# Patient Record
Sex: Female | Born: 1950 | Race: White | Hispanic: No | Marital: Married | Smoking: Never smoker
Health system: Southern US, Community
[De-identification: ages and names within clinical notes are randomized; demographics above are authoritative.]

## PROBLEM LIST (undated history)

## (undated) ENCOUNTER — Emergency Department (HOSPITAL_COMMUNITY): Admission: EM | Source: Home / Self Care

## (undated) DIAGNOSIS — E669 Obesity, unspecified: Secondary | ICD-10-CM

## (undated) DIAGNOSIS — E785 Hyperlipidemia, unspecified: Secondary | ICD-10-CM

## (undated) DIAGNOSIS — F419 Anxiety disorder, unspecified: Secondary | ICD-10-CM

## (undated) DIAGNOSIS — Z800 Family history of malignant neoplasm of digestive organs: Secondary | ICD-10-CM

## (undated) DIAGNOSIS — Z808 Family history of malignant neoplasm of other organs or systems: Secondary | ICD-10-CM

## (undated) DIAGNOSIS — C801 Malignant (primary) neoplasm, unspecified: Secondary | ICD-10-CM

## (undated) DIAGNOSIS — I10 Essential (primary) hypertension: Secondary | ICD-10-CM

## (undated) DIAGNOSIS — Z85820 Personal history of malignant melanoma of skin: Secondary | ICD-10-CM

## (undated) DIAGNOSIS — M1990 Unspecified osteoarthritis, unspecified site: Secondary | ICD-10-CM

## (undated) DIAGNOSIS — Z803 Family history of malignant neoplasm of breast: Secondary | ICD-10-CM

## (undated) HISTORY — DX: Family history of malignant neoplasm of breast: Z80.3

## (undated) HISTORY — DX: Malignant (primary) neoplasm, unspecified: C80.1

## (undated) HISTORY — DX: Family history of malignant neoplasm of other organs or systems: Z80.8

## (undated) HISTORY — DX: Personal history of malignant melanoma of skin: Z85.820

## (undated) HISTORY — DX: Obesity, unspecified: E66.9

## (undated) HISTORY — PX: ABDOMINAL HYSTERECTOMY: SHX81

## (undated) HISTORY — DX: Hyperlipidemia, unspecified: E78.5

## (undated) HISTORY — PX: BREAST EXCISIONAL BIOPSY: SUR124

## (undated) HISTORY — DX: Unspecified osteoarthritis, unspecified site: M19.90

## (undated) HISTORY — PX: OTHER SURGICAL HISTORY: SHX169

## (undated) HISTORY — DX: Anxiety disorder, unspecified: F41.9

## (undated) HISTORY — DX: Family history of malignant neoplasm of digestive organs: Z80.0

## (undated) HISTORY — DX: Essential (primary) hypertension: I10

---

## 1976-05-12 HISTORY — PX: APPENDECTOMY: SHX54

## 2000-02-04 ENCOUNTER — Other Ambulatory Visit: Admission: RE | Admit: 2000-02-04 | Discharge: 2000-02-04 | Payer: Self-pay | Admitting: Obstetrics and Gynecology

## 2000-02-28 ENCOUNTER — Encounter: Admission: RE | Admit: 2000-02-28 | Discharge: 2000-05-28 | Payer: Self-pay | Admitting: Obstetrics and Gynecology

## 2000-03-05 ENCOUNTER — Encounter: Payer: Self-pay | Admitting: Obstetrics and Gynecology

## 2000-03-05 ENCOUNTER — Encounter: Admission: RE | Admit: 2000-03-05 | Discharge: 2000-03-05 | Payer: Self-pay | Admitting: Obstetrics and Gynecology

## 2000-03-10 ENCOUNTER — Encounter: Admission: RE | Admit: 2000-03-10 | Discharge: 2000-03-10 | Payer: Self-pay | Admitting: Obstetrics and Gynecology

## 2000-03-10 ENCOUNTER — Encounter: Payer: Self-pay | Admitting: Obstetrics and Gynecology

## 2001-03-16 ENCOUNTER — Encounter: Admission: RE | Admit: 2001-03-16 | Discharge: 2001-03-16 | Payer: Self-pay | Admitting: Obstetrics and Gynecology

## 2001-03-16 ENCOUNTER — Encounter: Payer: Self-pay | Admitting: Obstetrics and Gynecology

## 2001-06-29 ENCOUNTER — Other Ambulatory Visit: Admission: RE | Admit: 2001-06-29 | Discharge: 2001-06-29 | Payer: Self-pay | Admitting: Obstetrics and Gynecology

## 2002-03-17 ENCOUNTER — Encounter: Admission: RE | Admit: 2002-03-17 | Discharge: 2002-03-17 | Payer: Self-pay | Admitting: Obstetrics and Gynecology

## 2002-03-17 ENCOUNTER — Encounter: Payer: Self-pay | Admitting: Obstetrics and Gynecology

## 2002-07-18 ENCOUNTER — Other Ambulatory Visit: Admission: RE | Admit: 2002-07-18 | Discharge: 2002-07-18 | Payer: Self-pay | Admitting: Obstetrics and Gynecology

## 2002-10-18 ENCOUNTER — Ambulatory Visit (HOSPITAL_COMMUNITY): Admission: RE | Admit: 2002-10-18 | Discharge: 2002-10-18 | Payer: Self-pay | Admitting: Obstetrics and Gynecology

## 2002-10-19 ENCOUNTER — Encounter (INDEPENDENT_AMBULATORY_CARE_PROVIDER_SITE_OTHER): Payer: Self-pay

## 2003-01-24 ENCOUNTER — Encounter: Payer: Self-pay | Admitting: Gastroenterology

## 2003-01-24 ENCOUNTER — Encounter: Admission: RE | Admit: 2003-01-24 | Discharge: 2003-01-24 | Payer: Self-pay | Admitting: Gastroenterology

## 2003-02-01 ENCOUNTER — Encounter (INDEPENDENT_AMBULATORY_CARE_PROVIDER_SITE_OTHER): Payer: Self-pay | Admitting: Specialist

## 2003-02-01 ENCOUNTER — Ambulatory Visit (HOSPITAL_COMMUNITY): Admission: RE | Admit: 2003-02-01 | Discharge: 2003-02-01 | Payer: Self-pay | Admitting: Gastroenterology

## 2003-02-03 ENCOUNTER — Encounter: Payer: Self-pay | Admitting: Obstetrics and Gynecology

## 2003-02-03 ENCOUNTER — Ambulatory Visit (HOSPITAL_COMMUNITY): Admission: RE | Admit: 2003-02-03 | Discharge: 2003-02-03 | Payer: Self-pay | Admitting: Obstetrics and Gynecology

## 2003-02-06 ENCOUNTER — Ambulatory Visit (HOSPITAL_COMMUNITY): Admission: RE | Admit: 2003-02-06 | Discharge: 2003-02-06 | Payer: Self-pay | Admitting: Gastroenterology

## 2003-02-07 ENCOUNTER — Encounter (INDEPENDENT_AMBULATORY_CARE_PROVIDER_SITE_OTHER): Payer: Self-pay | Admitting: *Deleted

## 2003-02-09 ENCOUNTER — Encounter: Admission: RE | Admit: 2003-02-09 | Discharge: 2003-02-09 | Payer: Self-pay | Admitting: Obstetrics and Gynecology

## 2003-02-09 ENCOUNTER — Encounter: Payer: Self-pay | Admitting: Obstetrics and Gynecology

## 2003-02-09 ENCOUNTER — Encounter (INDEPENDENT_AMBULATORY_CARE_PROVIDER_SITE_OTHER): Payer: Self-pay | Admitting: *Deleted

## 2003-02-09 ENCOUNTER — Ambulatory Visit (HOSPITAL_COMMUNITY): Admission: RE | Admit: 2003-02-09 | Discharge: 2003-02-09 | Payer: Self-pay | Admitting: Obstetrics and Gynecology

## 2003-02-15 ENCOUNTER — Encounter (INDEPENDENT_AMBULATORY_CARE_PROVIDER_SITE_OTHER): Payer: Self-pay | Admitting: Specialist

## 2003-02-15 ENCOUNTER — Encounter (INDEPENDENT_AMBULATORY_CARE_PROVIDER_SITE_OTHER): Payer: Self-pay

## 2003-02-15 ENCOUNTER — Ambulatory Visit (HOSPITAL_COMMUNITY): Admission: RE | Admit: 2003-02-15 | Discharge: 2003-02-15 | Payer: Self-pay | Admitting: Obstetrics and Gynecology

## 2003-03-20 ENCOUNTER — Ambulatory Visit (HOSPITAL_COMMUNITY): Admission: RE | Admit: 2003-03-20 | Discharge: 2003-03-20 | Payer: Self-pay | Admitting: General Surgery

## 2003-03-29 ENCOUNTER — Ambulatory Visit: Admission: RE | Admit: 2003-03-29 | Discharge: 2003-03-29 | Payer: Self-pay | Admitting: Gynecologic Oncology

## 2003-05-08 ENCOUNTER — Ambulatory Visit (HOSPITAL_COMMUNITY): Admission: RE | Admit: 2003-05-08 | Discharge: 2003-05-08 | Payer: Self-pay | Admitting: Gynecology

## 2003-05-12 ENCOUNTER — Ambulatory Visit (HOSPITAL_COMMUNITY): Admission: RE | Admit: 2003-05-12 | Discharge: 2003-05-12 | Payer: Self-pay | Admitting: Gynecology

## 2003-05-31 ENCOUNTER — Ambulatory Visit: Admission: RE | Admit: 2003-05-31 | Discharge: 2003-05-31 | Payer: Self-pay | Admitting: Gynecologic Oncology

## 2003-08-24 ENCOUNTER — Ambulatory Visit (HOSPITAL_COMMUNITY): Admission: RE | Admit: 2003-08-24 | Discharge: 2003-08-24 | Payer: Self-pay | Admitting: Gynecologic Oncology

## 2003-08-29 ENCOUNTER — Ambulatory Visit: Admission: RE | Admit: 2003-08-29 | Discharge: 2003-08-29 | Payer: Self-pay | Admitting: Gynecologic Oncology

## 2004-02-22 ENCOUNTER — Other Ambulatory Visit: Admission: RE | Admit: 2004-02-22 | Discharge: 2004-02-22 | Payer: Self-pay | Admitting: Obstetrics and Gynecology

## 2004-03-08 ENCOUNTER — Encounter: Admission: RE | Admit: 2004-03-08 | Discharge: 2004-03-08 | Payer: Self-pay | Admitting: Obstetrics and Gynecology

## 2004-03-19 ENCOUNTER — Ambulatory Visit (HOSPITAL_COMMUNITY): Admission: RE | Admit: 2004-03-19 | Discharge: 2004-03-19 | Payer: Self-pay | Admitting: Obstetrics and Gynecology

## 2004-08-13 ENCOUNTER — Encounter (INDEPENDENT_AMBULATORY_CARE_PROVIDER_SITE_OTHER): Payer: Self-pay | Admitting: Specialist

## 2004-08-13 ENCOUNTER — Ambulatory Visit (HOSPITAL_COMMUNITY): Admission: RE | Admit: 2004-08-13 | Discharge: 2004-08-13 | Payer: Self-pay | Admitting: Obstetrics and Gynecology

## 2004-11-13 ENCOUNTER — Ambulatory Visit (HOSPITAL_COMMUNITY): Admission: RE | Admit: 2004-11-13 | Discharge: 2004-11-13 | Payer: Self-pay | Admitting: Orthopedic Surgery

## 2004-11-13 ENCOUNTER — Ambulatory Visit (HOSPITAL_BASED_OUTPATIENT_CLINIC_OR_DEPARTMENT_OTHER): Admission: RE | Admit: 2004-11-13 | Discharge: 2004-11-13 | Payer: Self-pay | Admitting: Orthopedic Surgery

## 2005-01-23 ENCOUNTER — Encounter: Admission: RE | Admit: 2005-01-23 | Discharge: 2005-01-23 | Payer: Self-pay | Admitting: Orthopedic Surgery

## 2005-04-23 ENCOUNTER — Other Ambulatory Visit: Admission: RE | Admit: 2005-04-23 | Discharge: 2005-04-23 | Payer: Self-pay | Admitting: Obstetrics and Gynecology

## 2005-04-25 ENCOUNTER — Ambulatory Visit (HOSPITAL_COMMUNITY): Admission: RE | Admit: 2005-04-25 | Discharge: 2005-04-25 | Payer: Self-pay | Admitting: Obstetrics and Gynecology

## 2005-05-21 ENCOUNTER — Encounter: Admission: RE | Admit: 2005-05-21 | Discharge: 2005-05-21 | Payer: Self-pay | Admitting: Obstetrics and Gynecology

## 2006-06-01 ENCOUNTER — Encounter: Admission: RE | Admit: 2006-06-01 | Discharge: 2006-06-01 | Payer: Self-pay | Admitting: Obstetrics and Gynecology

## 2006-06-04 ENCOUNTER — Encounter: Admission: RE | Admit: 2006-06-04 | Discharge: 2006-06-04 | Payer: Self-pay | Admitting: Obstetrics and Gynecology

## 2006-07-17 ENCOUNTER — Ambulatory Visit (HOSPITAL_COMMUNITY): Admission: RE | Admit: 2006-07-17 | Discharge: 2006-07-17 | Payer: Self-pay | Admitting: Obstetrics and Gynecology

## 2006-08-25 ENCOUNTER — Encounter (INDEPENDENT_AMBULATORY_CARE_PROVIDER_SITE_OTHER): Payer: Self-pay | Admitting: Specialist

## 2006-08-25 ENCOUNTER — Inpatient Hospital Stay (HOSPITAL_COMMUNITY): Admission: RE | Admit: 2006-08-25 | Discharge: 2006-08-27 | Payer: Self-pay | Admitting: Obstetrics and Gynecology

## 2007-06-09 ENCOUNTER — Encounter: Admission: RE | Admit: 2007-06-09 | Discharge: 2007-06-09 | Payer: Self-pay | Admitting: Obstetrics and Gynecology

## 2008-05-12 HISTORY — PX: OTHER SURGICAL HISTORY: SHX169

## 2008-06-19 ENCOUNTER — Encounter: Admission: RE | Admit: 2008-06-19 | Discharge: 2008-06-19 | Payer: Self-pay | Admitting: Family Medicine

## 2008-08-02 ENCOUNTER — Encounter: Admission: RE | Admit: 2008-08-02 | Discharge: 2008-08-02 | Payer: Self-pay | Admitting: Family Medicine

## 2008-09-20 ENCOUNTER — Inpatient Hospital Stay (HOSPITAL_COMMUNITY): Admission: RE | Admit: 2008-09-20 | Discharge: 2008-09-22 | Payer: Self-pay | Admitting: Neurosurgery

## 2009-07-23 ENCOUNTER — Encounter: Admission: RE | Admit: 2009-07-23 | Discharge: 2009-07-23 | Payer: Self-pay | Admitting: Obstetrics and Gynecology

## 2010-03-19 ENCOUNTER — Ambulatory Visit: Payer: Self-pay | Admitting: Cardiology

## 2010-03-19 ENCOUNTER — Telehealth (INDEPENDENT_AMBULATORY_CARE_PROVIDER_SITE_OTHER): Payer: Self-pay | Admitting: *Deleted

## 2010-03-20 ENCOUNTER — Encounter: Payer: Self-pay | Admitting: Cardiology

## 2010-03-20 ENCOUNTER — Encounter (HOSPITAL_COMMUNITY)
Admission: RE | Admit: 2010-03-20 | Discharge: 2010-05-11 | Payer: Self-pay | Source: Home / Self Care | Attending: Cardiology | Admitting: Cardiology

## 2010-03-20 ENCOUNTER — Ambulatory Visit: Payer: Self-pay

## 2010-03-20 ENCOUNTER — Ambulatory Visit: Payer: Self-pay | Admitting: Cardiology

## 2010-03-21 ENCOUNTER — Ambulatory Visit: Payer: Self-pay | Admitting: Cardiology

## 2010-03-21 ENCOUNTER — Encounter: Payer: Self-pay | Admitting: Cardiology

## 2010-03-21 ENCOUNTER — Ambulatory Visit: Payer: Self-pay

## 2010-03-21 ENCOUNTER — Ambulatory Visit (HOSPITAL_COMMUNITY): Admission: RE | Admit: 2010-03-21 | Discharge: 2010-03-21 | Payer: Self-pay | Admitting: Cardiology

## 2010-03-27 ENCOUNTER — Ambulatory Visit: Payer: Self-pay | Admitting: Cardiology

## 2010-06-02 ENCOUNTER — Encounter: Payer: Self-pay | Admitting: Obstetrics and Gynecology

## 2010-06-11 NOTE — Assessment & Plan Note (Signed)
Summary: Cardiology Nuclear Testing  Nuclear Med Background Indications for Stress Test: Evaluation for Ischemia     Symptoms: Chest Pain, Chest Pain with Exertion, Chest Tightness, Diaphoresis    Nuclear Pre-Procedure Cardiac Risk Factors: Family History - CAD, Hypertension, Lipids, Obesity Caffeine/Decaff Intake: None NPO After: 11:00 PM Lungs: clear IV 0.9% NS with Angio Cath: 22g     IV Site: R Antecubital IV Started by: Irean Hong, RN Chest Size (in) 42     Cup Size DD     Height (in): 64 Weight (lb): 233 BMI: 40.14  Nuclear Med Study 1 or 2 day study:  2 day     Stress Test Type:  Treadmill/Lexiscan Reading MD:  Olga Millers, MD     Referring MD:  S.Tennant Resting Radionuclide:  Technetium 58m Tetrofosmin     Resting Radionuclide Dose:  33 mCi  Stress Radionuclide:  Technetium 70m Tetrofosmin     Stress Radionuclide Dose:  32.5 mCi   Stress Protocol  Max Systolic BP: 141 mm Hg Lexiscan: 0.4 mg   Stress Test Technologist:  Milana Na, EMT-P     Nuclear Technologist:  Domenic Polite, CNMT  Rest Procedure  Myocardial perfusion imaging was performed at rest 45 minutes following the intravenous administration of Technetium 2m Tetrofosmin.  Stress Procedure  The patient received IV Lexiscan 0.4 mg over 15-seconds with concurrent low level exercise and then Technetium 37m Tetrofosmin was injected at 30-seconds while the patient continued walking one more minute.  There were no significant changes with Lexiscan.  Quantitative spect images were obtained after a 45 minute delay.  QPS Raw Data Images:  Acquisition technically good; normal left ventricular size. Stress Images:  Normal homogeneous uptake in all areas of the myocardium. Rest Images:  Normal homogeneous uptake in all areas of the myocardium. Subtraction (SDS):  No evidence of ischemia. Transient Ischemic Dilatation:  0.90  (Normal <1.22)  Lung/Heart Ratio:  0.27  (Normal <0.45)  Quantitative  Gated Spect Images QGS EDV:  102 ml QGS ESV:  29 ml QGS EF:  72 % QGS cine images:  Normal wall motion.   Overall Impression  Exercise Capacity: Lexiscan with no exercise. BP Response: Normal blood pressure response. Clinical Symptoms: No chest pain ECG Impression: No significant ST segment change suggestive of ischemia. Overall Impression: Normal lexiscan nuclear study with no ischemia or infarction.  Appended Document: Cardiology Nuclear Testing copy sent to Dr. Deborah Chalk

## 2010-06-11 NOTE — Progress Notes (Signed)
Summary: nuc pre procedure  Phone Note Outgoing Call Call back at Home Phone [phone]   Call placed by: Cathlyn Parsons RN,  March 19, 2010 5:03 PM Call placed to: Patient Reason for Call: Confirm/change Appt Summary of Call: Reviewed information on [employer] Information Sheet (see scanned document for further details).  Spoke with patients husband.     Nuclear Med Background Indications for Stress Test: Evaluation for Ischemia     Symptoms: Chest Pain, Chest Pain with Exertion, Chest Tightness, Diaphoresis    Nuclear Pre-Procedure Cardiac Risk Factors: Family History - CAD, Hypertension, Lipids, Obesity

## 2010-08-20 LAB — BASIC METABOLIC PANEL
BUN: 16 mg/dL (ref 6–23)
CO2: 27 mEq/L (ref 19–32)
Calcium: 9.4 mg/dL (ref 8.4–10.5)
Chloride: 100 mEq/L (ref 96–112)
Creatinine, Ser: 0.73 mg/dL (ref 0.4–1.2)
GFR calc Af Amer: >60 mL/min (ref >60)
GFR calc non Af Amer: >60 mL/min (ref >60)
Glucose, Bld: 87 mg/dL (ref 70–99)
Potassium: 4 mEq/L (ref 3.5–5.1)
Sodium: 134 mEq/L — ABNORMAL LOW (ref 135–145)

## 2010-08-20 LAB — CBC
HCT: 24.1 % — ABNORMAL LOW (ref 36.0–46.0)
HCT: 36.3 % (ref 36.0–46.0)
Hemoglobin: 12.5 g/dL (ref 12.0–15.0)
Hemoglobin: 8.4 g/dL — ABNORMAL LOW (ref 12.0–15.0)
MCHC: 34.5 g/dL (ref 30.0–36.0)
MCV: 84.1 fL (ref 78.0–100.0)
MCV: 84.3 fL (ref 78.0–100.0)
Platelets: 363 10*3/uL (ref 150–400)
RBC: 2.87 MIL/uL — ABNORMAL LOW (ref 3.87–5.11)
RBC: 4.31 MIL/uL (ref 3.87–5.11)
RDW: 13.6 % (ref 11.5–15.5)
WBC: 7.3 10*3/uL (ref 4.0–10.5)
WBC: 9.7 10*3/uL (ref 4.0–10.5)

## 2010-08-20 LAB — TYPE AND SCREEN
ABO/RH(D): O POS
Antibody Screen: NEGATIVE

## 2010-08-20 LAB — ABO/RH: ABO/RH(D): O POS

## 2010-09-24 NOTE — H&P (Signed)
NAME:  [name], [name]             ACCOUNT NO.:  [id number]   MEDICAL RECORD NO.:  [id number]          PATIENT TYPE:  INP   LOCATION:  3031                         FACILITY:  [facility]   PHYSICIAN:  Hewitt Shorts, M.D.DATE OF BIRTH:  [birth year]   DATE OF ADMISSION:  09/20/2008  DATE OF DISCHARGE:                              HISTORY & PHYSICAL   HISTORY OF PRESENT ILLNESS:  The patient is a 60 year old left-handed  white female whom I evaluated for lumbar degeneration and resulting low  back and right lumbar radicular pain.  The patient has had pain from the  low back down to the right buttock, lateral right thigh and leg, into  the dorsum of the right foot for the past 2 years worse over the past 5  months.   She is treated with prednisone dose pack and ongoing ibuprofen, continue  to have pain, discomfort.  X-rays showed multilevel degenerative disk  disease, spondylosis with a grade I anterolisthesis of L5 on S1 that  appears to be static to flexion and extension.  MRI scan shows at L4-5  advanced facet arthropathy with circumferential disk bulging and  significant canal stenosis with bilateral neural foraminal stenosis  right worse than left at L5-S1, grade I degenerative spondylolisthesis  with bilateral facet arthropathy but more mild canal stenosis.   A decision was made to proceed with decompression and arthrodesis.   PAST MEDICAL HISTORY:  Notable for history of hypertension for the past  2 years, history of a melanoma removed in 2007, no history of myocardial  infarction, stroke, diabetes, peptic ulcer disease, or lung disease.   PREVIOUS SURGERIES:  Hysterectomy, bilateral knee arthroscopy, right  kidney surgery, appendectomy, and resection of melanoma.   ALLERGIES:  No known allergies.   MEDICATIONS:  1. Bisoprolol/hydrochlorothiazide 5/6.25 daily for hypertension.  2. Vivelle-Dot 0.1 mg patch twice a week.  3. Lipitor 20 mg daily.  4. Estradiol 1 mg daily.  5.  Fluoxetine 20 mg daily.  6. Diclofenac p.r.n.  7. Ibuprofen p.r.n.  8. Lunesta 3 mg p.r.n.   FAMILY HISTORY:  Mother died of lung cancer at age 28, father is in fair  health at age 66 with heart disease.  There is a family history as well  of hypertension.   SOCIAL HISTORY:  The patient is married, she is a retired Runner, broadcasting/film/video.  She  does not smoke.  She does drink alcohol socially.  She denies history of  substance abuse.   REVIEW OF SYSTEMS:  Notable for those described in the history of  present illness and past medical history, but is otherwise unremarkable.   PHYSICAL EXAMINATION:  GENERAL:  The patient is a well-developed and  well-nourished white female in no acute distress.  VITAL SIGNS:  Temperature 97.9, pulse 58, blood pressure 119/78,  respiratory rate 20, height 5 feet 3 inches, and weight 210 pounds.  LUNGS:  Clear to auscultation.  She has symmetrical respiratory  excursion.  HEART:  Regular rate and rhythm.  Normal S1 and S2.  There is no murmur.  EXTREMITIES:  No clubbing, cyanosis, or edema.  MUSCULOSKELETAL:  She  was able to flex 90 degrees and able to extend to  10 degrees, mild discomfort with flexion, and extension.  Mild  tenderness to palpation in the right, no radiation down to the left, no  tenderness over the lumbar spinous process.  Straight leg raising is  negative bilaterally.  NEUROLOGIC:  5/5 strength in the lower extremities in the iliopsoas,  quadriceps, dorsiflexors, extensor hallucis longus, and plantar flexor  bilaterally.  Sensation is intact to pinprick to the upper and lower  extremities.  Reflexes are minimal in the biceps, brachioradialis, and  triceps, 1 in the quadriceps, minimal in the gastrocnemius but are  symmetrical bilaterally.  Toes are downgoing bilaterally.  She has a  normal gait and stance.   IMPRESSION:  The patient with low back and right lumbar radicular pain  with advanced degenerative disk disease and spondylosis in the  lower  lumbar spine particularly at L4-5 and L5-S1 with mild changes above  that.  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   PLAN:  Options of the nonsurgical management versus surgical  decompression and arthrodesis were discussed.  We also included mostly  nonsurgical management and continuing current symptomatic treatment with  medications versus epidural steroid injections.  As regard to surgery,  we discussed a bilateral L4-5 and L5-S1 lumbar decompression including  laminotomy, facetectomy, foraminotomy, and microdiscectomy.  Bilateral  L4-5 and L5-S1 posterior lumbar interbody fusion with interbody implants  and bone graft and bilateral L4-5 and L5-S1 posterolateral arthrodesis  with posterior instrumentation and bone graft.   I have discussed all these.  She would like to go ahead with surgery and  is admitted for such.  We discussed the nature and extent of the  surgery, typical length of surgery, hospital stay, and overall  recuperation, need for postoperative immobilization in a lumbar brace  and risks including risks of infection, bleeding, possible need for  transfusion, risk of nerve dysfunction with pain, weakness, numbness, or  paresthesias, the risk of dural tear and CSF leakage, possible need of  further surgery, the risk of failure of the arthrodesis and possible  need of further surgery and anesthetic risk of myocardial infarction,  stroke, pneumonia, and death.  Understanding all this, she would like to  go ahead with surgery and is now admitted for such.      Hewitt Shorts, M.D.  Electronically Signed     RWN/MEDQ  D:  09/20/2008  T:  09/20/2008  Job:  [id number]

## 2010-09-24 NOTE — Op Note (Signed)
NAME:  [name] [name], [name] [name]             ACCOUNT NO.:  [id number]   MEDICAL RECORD NO.:  [id number]          PATIENT TYPE:  INP   LOCATION:  3031                         FACILITY:  [facility]   PHYSICIAN:  Hewitt Shorts, M.D.DATE OF BIRTH:  1950-09-04   DATE OF PROCEDURE:  09/20/2008  DATE OF DISCHARGE:                               OPERATIVE REPORT   PREOPERATIVE DIAGNOSES:  1. Lumbar spondylosis.  2. Lumbar degenerative disk disease.  3. Lumbar stenosis.  4. Lumbar radiculopathy.   POSTOPERATIVE DIAGNOSES:  1. Lumbar spondylosis.  2. Lumbar degenerative disk disease.  3. Lumbar stenosis.  4. Lumbar radiculopathy.   PROCEDURE:  Bilateral L4-5 and L5-S1 lumbar decompression including  laminotomy, facetectomy, foraminotomy, and microdiskectomy with  microdissection with decompression beyond that required for interbody  fusion, bilateral L4-5 and L5-S1 posterior lumbar interbody fusion with  AVS PEEK interbody implants and mosaic with bone marrow aspirate and  bilateral L5-S1 posterolateral arthrodesis with radius posterior  instrumentation and mosaic with bone marrow aspirates and infuse.   SURGEON:  Hewitt Shorts, MD   ASSISTANT:  Nelia Shi. Webb Silversmith, RN and Cristi Loron, MD   ANESTHESIA:  General endotracheal.   INDICATIONS:  The patient is a 60 year old woman who presents with low  back and radicular pain.  She has advanced degenerative disk disease and  spondylosis in the lumbar spine particularly at L4-5 and L5-S1 with  associated degenerative scoliosis of the lower lumbar spine and static  grade 1 spondylolisthesis at L4-L5 and bilateral facet arthropathy at L4-  5 with neuroforaminal stenosis right versus left and significant canal  stenosis at L5-S1.  There is significant bilateral facet arthropathy as  well with mild stenosis.  A decision was made to proceed with  decompression and arthrodesis.   PROCEDURE:  The patient was brought to the operating room and  placed  under general endotracheal anesthesia.  The patient was turned to a  prone position.  Lumbar region was prepped with Betadine soap and  solution, and draped in a sterile fashion.  The midline was infiltrated  with local anesthetic with epinephrine and midline incision was made  over the L4-5 and L5-S1 level, this was then carried down through the  subcutaneous tissue.  Bipolar cautery and electrocautery were used to  maintain hemostasis.  Dissection was carried down to the lumbar fascia,  which was incised bilaterally and the paraspinal muscles were dissected  from the spinous process and lamina in a subperiosteal fashion.  A self-  retaining retractor was placed and the L4-5 and L5-S1 interlaminar space  was identified and an x-ray was taken to confirm the localization and  then using microdissection and microsurgical technique with  magnification, we proceeded with decompression.   Bilateral L4-5 and L5-S1 laminotomy and facetectomy was performed using  the XMax drill and Kerrison punches.  Ligamentum flavum was thickened  particularly at the L4-5 level and this was carefully removed so as to  decompress the thecal sac and spinal canal.  We then turned to our  attention to the neural foramina where there was significant  hypertrophic facet arthropathy.  The  hypertrophic facets were carefully  removed and we were able to decompress the exiting L4, L5, and S1 nerve  roots bilaterally.  Once the laminotomy, facetectomy, and foraminotomy  was performed, we proceeded with microdiskectomy.  This was performed  bilaterally at L4-5 and L5-S1.  There was significant spondylitic disk  protrusion at each level.  The annulus was incised.  The spondylitic  overgrowth was removed using an osteophyte removal tool bilaterally at  each level.  Disk space was entered using a variety of microcurettes and  pituitary rongeurs and third diskectomy was performed.  We then began to  prepare the end  plates to reach the vertebral body surfaces with paddle  curettes and once we were down to good bony surfaces, we measured the  height of the intervertebral disk space.  There were some asymmetry at  L4-5; therefore, we used different size grafts in the left and the  right.   Once the end plates were prepared, we draped the C-arm fluoroscope was  brought to the field, we identified the left L5 pedicle.  It was probed  with pedicle probe and bone marrow aspirate was aspirated from the  vertebral body and injected over a 15 mL strip of mosaic.  We then  packed each of the AVS PEEK interbody implants with the mosaic with bone  marrow aspirate.  All the implants were 25 mm in depth and 4 degrees of  lordosis.  We used a 10-mm implant in the left at L4-5 and a 9-mm  implant on the right at L4-5 and bilateral 8-mm implants at L5-S1.   Carefully retracting the thecal sac and nerve root first on the right  side at L4-5, we placed the 9-mm implants.  We then packed additional  mosaic with bone marrow aspirate in the midline and then placed the left-  sided 10-mm implants.  Each of these was countersunk.  We then similarly  placed the 8-mm implant on the right L4-5, subsequently on the left at  L4-5 and again they were both countersunk.  Once all 4 implants were in  place, we proceeded with posterolateral arthrodesis.   With C-arm fluoroscopic guidance, we probed each of the remaining 5  pedicles, bilateral L4, on the right side L5, and bilaterally S1.  Each  of the 6 pedicles was examined with a ball probe.  Good bony surfaces  were noted.  Each of the pedicles were intact with a 5.25-mm tap.  Each  of them was examined with a ball probe, good threading was noted, and  good bony surfaces were noted.  We placed 5.75-mm screws bilaterally at  each level, placing 40-mm screws bilaterally at L4 and L5 and 30-mm  screws bilaterally at S1.  Once all 6 screws were in place, an AP view  shot was taken.   We did question the positioning of the right L4 screw;  however, we removed it, examined the bony surfaces, and again found good  bony surfaces on the superior, lateral, inferior, and medial aspects and  the screw was replaced.   We selected 60-mm prelordosed rods that were placed within the screw  heads and secured with locking caps.  Once all 6 locking caps were in  place, we tightened against the counter torque.  We previously  decorticated the transverse processes in facets of L4, L5, and S1 (the  ala of S1) and then packed Infuse and the remaining mosaic with bone  marrow aspirate in the lateral gutters over  the transverse process and  intertransverse space.  The wound was irrigated extensively with saline  solution, Bacitracin solution throughout the procedure.  Good hemostasis  was maintained.  We did use a Cell Saver during the procedure.  The  estimated blood loss was 600 mL and we were able to return 205 mL of  Cell Saver blood to the patient.   Once hemostasis was confirmed, we proceeded with closure.  The deep  fascia was closed with interrupted undyed #1 Vicryl sutures.  Scarpa  fascia was closed with interrupted undyed #1 Vicryl sutures.  The  subcutaneous and subcuticular were closed with interrupted inverted 2-0  Vicryl sutures.  The skin was approximated with Dermabond.  The wound was dressed with Adaptic, sterile gauze, and 4-inch Hypafix.  The procedure was tolerated well.  Following the surgery, the patient  was turned back to the supine position, reversed from the anesthetic,  extubated, and transferred to the recovery room for further care.      Hewitt Shorts, M.D.  Electronically Signed     RWN/MEDQ  D:  09/20/2008  T:  09/21/2008  Job:  [id number]

## 2010-09-27 NOTE — Op Note (Signed)
NAME:  [name] [name], [name] [name]                       ACCOUNT NO.:  [id number]   MEDICAL RECORD NO.:  [id number]                   PATIENT TYPE:  AMB   LOCATION:  ENDO                                 FACILITY:  [facility]   PHYSICIAN:  Anselmo Rod, M.D.               DATE OF BIRTH:  Jun 09, 1950   DATE OF PROCEDURE:  02/01/2003  DATE OF DISCHARGE:                                 OPERATIVE REPORT   PROCEDURE PERFORMED:  Colonoscopy with cold biopsies x6.   ENDOSCOPIST:  Anselmo Rod, M.D.   INSTRUMENT USED:  Olympus video colonoscope.   INDICATION FOR PROCEDURE:  A 60 year old white female undergoing screening  colonoscopy.  The patient was found to have an abnormal CT scan with omental  caking, a large uterine fibroid, and possibility of a malignancy even though  no definite mass was seen.  Rule out colonic polyps, masses, etc.   PREPROCEDURE PREPARATION:  Informed consent was procured from the patient.  The patient was fasted for eight hours prior to the procedure and prepped  with a bottle of magnesium citrate and a gallon of GoLYTELY the night prior  to the procedure.   PREPROCEDURE PHYSICAL:  VITAL SIGNS:  The patient had stable vital signs.  NECK:  Supple.  CHEST:  Clear to auscultation.  S1, S2 regular.  ABDOMEN:  Soft with normal bowel sounds.   DESCRIPTION OF PROCEDURE:  The patient was placed in the left lateral  decubitus position and sedated with 100 mg of Demerol and 10 mg of Versed  intravenously in slow incremental doses.  Once the patient was adequately  sedate and maintained on low-flow oxygen and continuous cardiac monitoring,  the Olympus video colonoscope was advanced from the rectum to the cecum  without difficulty.  The patient had a fairly good prep.  No masses or  polyps were seen.  Small ulceration was noted in the cecum.  There were a  few sigmoid colon diverticula present and small internal hemorrhoids seen on  retroflexion.  No other abnormalities were  appreciated.  The patient  tolerated the procedure well without complication.  The appendiceal orifice  and the ileocecal valve were clearly visualized and photographed.   IMPRESSION:  1. Small, nonbleeding internal hemorrhoids.  2. Left-sided diverticula (few).  3. Small ulceration seen in the cecum, biopsied for pathology.  4. No masses or polyps present.    RECOMMENDATIONS:  1. Await pathology results.  2. Follow up with Hal Morales, M.D., ASAP for possible exploratory     laparotomy.  3. Repeat CRC screening depending on pathology results.                                               Anselmo Rod, M.D.    JNM/MEDQ  D:  02/01/2003  T:  02/02/2003  Job:  [id number]   cc:   Tammy R. Collins [place], M.D.  [address]  [place]  [place] [address]  Fax: [phone]   Hal Morales, M.D.  [address]  [place]  [place] [address]  Fax: [phone]

## 2010-09-27 NOTE — Op Note (Signed)
NAME:  Filler, [place] S                       ACCOUNT NO.:  [id number]   MEDICAL RECORD NO.:  [id number]                   PATIENT TYPE:  AMB   LOCATION:  SDC                                  FACILITY:  [facility]   PHYSICIAN:  Hal Morales, M.D.             DATE OF BIRTH:  Jul 11, 1950   DATE OF PROCEDURE:  02/15/2003  DATE OF DISCHARGE:                                 OPERATIVE REPORT   PREOPERATIVE DIAGNOSES:  1. 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  2. Uterine fibroids.   POSTOPERATIVE DIAGNOSES:  1. 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  2. Uterine fibroids.   OPERATION:  Diagnostic laparoscopy.   SURGEON:  Hal Morales, M.D.   INTRAOPERATIVE CONSULTANT:  Angelia Mould. Derrell Lolling, M.D.   ANESTHESIA:  General orotracheal.   ESTIMATED BLOOD LOSS:  Less than 100 mL.   COMPLICATIONS:  None.   FINDINGS:  The uterus was enlarged to approximately 12-week size with  several myomata.  The ovaries appeared normal bilaterally without adhesions  or stigmata of endometriosis or excrescences. The pelvic peritoneum was  smooth without excrescences.  There was no free fluid noted in the pelvis.  There were, however, findings in the laparoscopy of the upper abdomen which  have been dictated in the note by Dr. Claud Kelp.   DESCRIPTION OF PROCEDURE:  The patient was taken to the operating room after  appropriate identification and placed on the operating table.  After the  attainment of adequate general anesthesia, she was placed in the modified  lithotomy position.  The abdomen, perineum and vagina were prepped with  multiple layers of Betadine.  A Foley catheter was inserted into the bladder  and connected to straight drainage.  A Hulka tenaculum was placed on the  cervix.  The abdomen was draped as a sterile field.  Subumbilical injections  of 0.25% Marcaine were undertaken and a subumbilical incision made.  A  Veress cannula was inserted into that incision and a  pneumoperitoneum  created with 4.5 liters of CO2.  The Veress cannula was removed and a  laparoscopic trocar placed through that incision into the peritoneal cavity.  The laparoscope was placed through the trocar sleeve.  Suprapubic incisions  were made to the right and left of midline and laparoscopic probe trocar  placed through that incision into the peritoneal cavity under  direct visualization.  The above noted findings were made and documented and  at that time the laparoscopy was continued in the upper abdomen with Dr.  Claud Kelp as the primary surgeon with a separate operative report of  the following procedures dictated.                                               Hal Morales, M.D.    VPH/MEDQ  D:  02/15/2003  T:  02/15/2003  Job:  [id number]

## 2010-09-27 NOTE — Op Note (Signed)
NAME:  [name] [name], [name] [name]                       ACCOUNT NO.:  [id number]   MEDICAL RECORD NO.:  [id number]                   PATIENT TYPE:  AMB   LOCATION:  SDC                                  FACILITY:  [facility]   PHYSICIAN:  Hal Morales, M.D.             DATE OF BIRTH:  [birth year]   DATE OF PROCEDURE:  10/18/2002  DATE OF DISCHARGE:                                 OPERATIVE REPORT   PREOPERATIVE DIAGNOSIS:  Menorrhagia and uterine fibroids.   POSTOPERATIVE DIAGNOSIS:  Menorrhagia and uterine fibroids.   SURGEON:  Hal Morales, M.D.   OPERATIONS:  1. Diagnostic hysteroscopy.  2. Diagnostic dilatation and curettage.  3. Hysteroscopic resection of uterine fibroid.   ANESTHESIA:  General LMA.   ESTIMATED BLOOD LOSS:  100 mL.   COMPLICATIONS:  None.   FINDINGS:  The uterus sounded to 8 cm.  There was a 2 cm uterine fibroid on  the posterior right surface of the uterus just above the endocervical os.  The remainder of the endometrium contained thin endometrium. The tubal ostia  bilaterally appeared normal.   PROCEDURE:  The patient was taken to the operating room after appropriate  identification and placed on the operating table.  After the administration  of adequate general anesthesia she was placed in the lithotomy position. The  perineum and vagina were prepped with multiple layers of Betadine and draped  as a sterile field.  The bladder was emptied with a red Robbins catheter.  A  Graves speculum was placed in the vagina and a single tooth tenaculum placed  on the anterior cervix. The uterus was sounded. The cervix was then dilated  to a #25 dilator to allow for the insertion of the diagnostic hysteroscope.  This was utilized and the above noted findings made and documented. The  cervix was then dilated to a #33 dilator to accommodate the operative  hysteroscope with a single loop electrocautery.  This was then introduced  and used to successively shave the  aforementioned uterine fibroid down to  the level of the endometrium.  The hysteroscope was then removed from the  uterine cavity and sharp curettage of the uterine cavity carried out with  minimal tissue.  Upon reinsertion of the operative hysteroscope, it was  noted that the aforementioned fibroid was no longer visible and hemostasis  was noted to be excellent.  All instruments were then removed from the  operative field and the single tooth tenaculum removed.  A suture of 2-0  Vicryl was used to achieve hemostasis at the tenaculum site.  The patient  was awakened from general anesthesia and taken to the recovery room in  satisfactory condition having tolerated the procedure well with sponge and  instrument counts correct.    SPECIMENS TO PATHOLOGY:  1. Endometrial curettings.  2. Uterine fibroid.  Hal Morales, M.D.    VPH/MEDQ  D:  10/18/2002  T:  10/18/2002  Job:  [id number]

## 2010-09-27 NOTE — Consult Note (Signed)
NAME:  [name] [name], [name] [name]                       ACCOUNT NO.:  [id number]   MEDICAL RECORD NO.:  [id number]                   PATIENT TYPE:  OUT   LOCATION:  GYN                                  FACILITY:  [facility]   PHYSICIAN:  John T. Kyla Balzarine, M.D.                 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   HISTORY OF PRESENT ILLNESS:  In May 2004, the patient underwent diagnostic  hysteroscopy with D&C and hysteroscopic resection of the uterine fibroid  using loop electrocautery. In September 2004, she had CT of the abdomen and  pelvis revealing nodular thickening present in the region of the omentum  without ascites.  A CT scan of the chest was negative and diagnostic  laparoscopy was performed in October 2004. She was found to have subtle  nodularity involving the omentum and anterior abdominal wall with normal  tubes and ovaries. A 19-cm portion of omentum was removed along with other  resections of peritoneal plaques. 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   Past history, social history, family history, and review of systems are  otherwise unchanged from those recorded during my intake evaluation in  November 2004.   PHYSICAL EXAMINATION:  VITAL SIGNS: Patient is afebrile, weight 209 pounds,  blood pressure 126/86.  GENERAL: Deferred.   I reviewed the CT of the abdomen and pelvis which revealed unchanged soft  tissue density in the omentum with no other abnormal soft tissue masses and  no ascites. There is a small simple cyst in the right adnexa currently  measuring 4 x 2.9 cm. CA-125 value is 13.8.   ASSESSMENT:  Peritoneal __________, without evidence of malignant.   PLAN:  I recommend that the patient see Dr. Dierdre Forth for follow-up  in  approximately four months. I would recommend that she have an ultrasound at  approximately that time to re-image her ovary, but if negative we could have  her simply be seen for routine annual examinations thereafter unless she  develops symptoms.   While I would be certainly glad to see the patient, I believe that the bulk  of her follow-up would be mostly carried out by Dr. Pennie Rushing.                                               John T. Kyla Balzarine, M.D.    JTS/MEDQ  D:  08/29/2003  T:  08/29/2003  Job:  [id number]   cc:   Hal Morales, M.D.  Fax: [phone]   Angelia Mould. Derrell Lolling, M.D.  [address]  [place]  [place] [address]  Fax: [phone]   Telford Nab, R.N.  [phone] [address]  [place], [place] [address]

## 2010-09-27 NOTE — Op Note (Signed)
NAME:  [name] [name], [name] [name]                       ACCOUNT NO.:  [id number]   MEDICAL RECORD NO.:  [id number]                   PATIENT TYPE:  AMB   LOCATION:  SDC                                  FACILITY:  [facility]   PHYSICIAN:  Angelia Mould. Derrell Lolling, M.D.             DATE OF BIRTH:  09-13-1950   DATE OF PROCEDURE:  02/15/2003  DATE OF DISCHARGE:                                 OPERATIVE REPORT   PREOPERATIVE DIAGNOSIS:  Omental nodules.   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   OPERATION PERFORMED:  1. Intraoperative general surgical consultation.  2. Diagnostic laparoscopy.  3. Partial omentectomy.  4. Biopsy of gastric serosal nodule.  5. Biopsy of parietal peritoneal nodule.   SURGEON:  Angelia Mould. Derrell Lolling, M.D.   FIRST ASSISTANT:  Hal Morales, M.D.   INDICATIONS FOR PROCEDURE:  This is a 60 year old white female who was being  evaluated by Dr. Anselmo Rod for some vague symptomatic complaints.  A  CT scan reported shows nodularity of the omentum.  The radiologist was  concerned about metastatic cancer.  An upper endoscopy and a colonoscopy are  reportedly normal.  A mammogram and a cystoscopy are reportedly normal.  The  CA125 is reportedly normal.  A pelvic examination is reportedly normal.  Dr.  Maris Berger [facility] asked me to come to the operating room to assist her with  a diagnostic laparoscopy.   DESCRIPTION OF PROCEDURE:  Following the induction of general endotracheal  anesthesia, the patient was placed in a modified dorsal lithotomy position  and the abdomen and perineum prepped and draped in a sterile fashion.  Dr.  Pennie Rushing inserted a 10 mm trocar below the umbilicus using a Veress needle  technique.  Pneumoperitoneum was created. She placed two 5 mm trocars in the  suprapubic area.  She examined the uterus and ovaries and other than  fibroids everything looked fine.  She did washings.   We  repositioned the patient and placed a 5 mm trocar in the right mid  abdomen and a 5 mm trocar in the left mid abdomen.   We feel that the omentum had numerous whitish nodules throughout.  We found  whitish nodules on the undersurface of the left lobe of the liver.  We found  a 2 cm whitish nodule sitting on top of the lesser curvature of the stomach  but it had a pulsatile nature to it so we did not biopsy that.  There were  some serosal nodules along the greater curvature of the stomach.  There was  a whitish serosal nodule along the porta hepatis.  There was some whitish  serosal nodules on the diaphragm but there were just a few of those.  The  liver otherwise looked healthy.  There was not much in the way of nodularity  of the liver.  The spleen looked normal.  There was  no ascites.  Other than  serosal disease, the stomach, duodenum, small-bowel and colon looked normal.  I examined the small-bowel all the way to the ligament of Treitz to the  ileocecal valve.  There were adhesions around the cecum from a previous  appendectomy but they did not seem to be much in the way of the serosal  nodularity in the lower abdomen or pelvis.  The root of the mesentery was  examined and there was no nodularity or mass there, although there was a  fullness at the root of the mesentery.   After Dr. Pennie Rushing finished the pelvic procedure, I biopsied a nodule off the  greater curvature of the stomach using the harmonic scalpel to carefully  lift this up and excise it.  The stomach was not injured.  I also biopsied a  serosal nodule on the parietal peritoneum in the right upper quadrant  overlying the costal margin.  I then did a generous partial omentectomy to  get a lot of tissue.  I used the harmonic scalpel and took about half of the  omentum across, keeping the colon in direct view at all times.  This was  placed in a specimen bag and removed.   Dr. Berneta Levins performed frozen sections on two  separate specimens and  could only identify benign fibrosis.  We presented the case to her and she  is going to do much more in the way of pathologic examination.   We irrigated and suctioned all the fluid out.  There was no bleeding from  anywhere.  We examined the stomach and the colon and the small-bowel.  There  was no injury to any of these organs.  The trocars were then removed under  direct vision and there was no bleeding from the trocar sites.  The  pneumoperitoneum was released.  The fascia below the umbilicus had to be  closed with four interrupted sutures of 0 Vicryl and the skin incisions were  closed with subcuticular sutures of 4-0 Vicryl and Steri-Strips.  Clean  bandages were placed and the patient was taken to the recovery room in  stable condition.  Estimated blood loss was about 22 mL.  No complications.  Sponge, needle and instrument counts were correct.                                               Angelia Mould. Derrell Lolling, M.D.    HMI/MEDQ  D:  02/15/2003  T:  02/15/2003  Job:  [id number]   cc:   Anselmo Rod, M.D.  [address]  [address]  [place]  [place] [address]  Fax: [phone]

## 2010-09-27 NOTE — H&P (Signed)
NAME:  [name] [name], [name] [name]             ACCOUNT NO.:  [id number]   MEDICAL RECORD NO.:  [id number]          PATIENT TYPE:  INP   LOCATION:  NA                           [facility]ACILITY:  [facility]   PHYSICIAN:  Hal Morales, M.D.DATE OF BIRTH:  1950/06/11   DATE OF ADMISSION:  08/22/2006  DATE OF DISCHARGE:                              HISTORY & PHYSICAL   HISTORY OF PRESENT ILLNESS:  Patient is a 60 year old, married, white  female, para 2-0-1-2, who presents for a total abdominal hysterectomy  with bilateral salpingo-oophorectomy because of symptomatic uterine  fibroids, abnormal uterine bleeding, and pelvic pain.  For the past  year, patient experienced almost daily abdominal pain, primarily in the  right lower quadrant, which worsened with activity and menstrual flow.  The patient rated her pain as a 7 out of 10 on a 10-point pain scale,  but had responded well to Advil 400 mg.  Over the past 6 months,  however, patient has had episodic vaginal bleeding ranging from a brown  stain to an actual flow requiring her to change a pad every hour.  The  patient's bleeding episode was further complicated by moodiness and  headache.  She denies any urinary tract infection symptoms, changes in  her bowel habits, vaginitis symptoms, or fever.   Previously, patient had undergone hysteroscopy with D&C on two separate  occasions for similar bleeding and was found to have a submucosal  fibroid and polyp, respectively.  In 2004 during an evaluation for GI  complaints, patient underwent a CT scan of her abdomen and pelvis and  was found to have omental caking.  The patient further underwent a  biopsy of her omentum, which showed fibrous chronic inflammation and  psammoma bodies.  There were no malignant cells identified.  As a result  of these findings, patient received a consultation with a gynecologist  specializing in oncology who recommended conservative monitoring of this  finding since patient was and  remains asymptomatic.  A followup CT scan  of her abdomen and pelvis in March 2008 confirmed the stability of her  omental nodularities (psammoma bodies).  The patient underwent an  endometrial biopsy in March 2008, which revealed no evidence of  malignancy.  She also received a CA-125 in April 2008, which was within  normal limits.  A pelvic ultrasound in March 2008 revealed a uterus  measuring 8.10 cm by 6.64 cm by 5.66 cm with a hypoechoic endometrial  mass measuring 1.05 cm by 0.94 cm.  Additionally 4 fibroids were  measured ranging in size from 1.69 cm to 3.78 cm.  Given the chronicity  of patient's irregular uterine bleeding and pelvic pain, she desires to  proceed with definitive therapy and management of these symptoms.   PAST MEDICAL HISTORY:  OB HISTORY:  Gravida 2, para 2-0-1-2.  GYN  HISTORY:  Menarche at 60 years old.  Last menstrual period May 24, 2006.  Patient uses vasectomy as her method of contraception.  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  Denies any history of  abnormal Pap smears.  Last normal Pap smear  and mammogram was in January  2008.  MEDICAL HISTORY:  Migraine equivalent, hypertension, depression,  PMJ, and Morton's neuroma, melanoma, CT scan abnormality leading to  omental biopsies with finding of psamoma bodies.   PAST SURGICAL HISTORY:  1973, periurethral vascular clip.  1977,  appendectomy.  1990, laparoscopy for pelvic pain (no significant  findings).  2000, right ACL repair.  2004, right breast biopsy  (fibroadenoma).  2004, hysteroscopy with D&C for irregular bleeding  (submucosal fibroid).  2004, diagnostic laparoscopy with omental biopsy.  2006, left knee surgery.  2006, hysteroscopy with D&C (resection of  endometrial polyp).  2007, right shoulder melanoma excision.  Denies any  problems with anesthesia.  Denies any history of blood transfusions.   FAMILY HISTORY:  Cardiovascular disease, hypertension, cancer (liver and   lung), and diabetes mellitus.   SOCIAL HISTORY:  Patient is retired and she lives with her husband.  HABITS:  She does not use tobacco.  Occasionally consumes alcohol.   MEDICATIONS:  1. Hydrochlorothiazide 25 mg daily.  2. Lipitor 20 mg daily.  3. Prozac 10 mg twice daily.  4. Vivelle Dot 0.025 twice weekly.  5. Calcium 1000 mg daily.  6. Omega 3 essential fatty acids one tablet daily.   ALLERGIES:  Patient has no known drug allergies.   REVIEW OF SYSTEMS:  Patient has occasional insomnia, menstrual headaches  (relieved with Vivelle Dot), occasional incontinence with sneezing.  Patient wears glasses.  She does have a temporary crown over her right  lower jaw teeth.  She denies any chest pain, shortness of breath, vision  changes, nausea, vomiting, diarrhea, fever, and except as is mentioned  in history of present illness patient's review of systems is negative.   PHYSICAL EXAMINATION:  VITAL SIGNS:  Blood pressure 130/82, weight 179-  1/2, height 5 feet 3-1/2 inches tall.  NECK:  Supple.  There are no masses, thyromegaly, or cervical  adenopathy.  CARDIOVASCULAR:  Heart regular rate and rhythm.  There is no murmur.  PULMONARY:  Lungs are clear.  BACK:  No costovertebral angle tenderness.  ABDOMEN:  No tenderness, masses, or organomegaly.  EXTREMITIES:  No clubbing, cyanosis, or edema.  PELVIC:  External genitalia, Bartholin's gland, urethra, and Skene's  glands (EGBUS) is within normal limits.  Vagina is atrophic.  Cervix is  nontender without lesions.  Uterus upper limit of normal size, shape,  and consistency.  There is no tenderness.  Adnexa no tenderness, or  masses.  Rectovaginal no tenderness, or masses.   IMPRESSION:  1. Symptomatic uterine fibroids.  2. Abnormal uterine bleeding.  3. Pelvic pain.  4. Psammoma bodies.   DISPOSITION:  A discussion was held with the patient regarding the indications for her procedure along with its risks, which include, but  are not  limited to infection, reaction to anesthesia, damage to adjacent  organs, and excessive bleeding.  Another discussion was held with  patient regarding her request for removal of psammoma bodies.  It was  explained that Psommoma bodies are a cellular entity and therefore would  not be visible for removal.  In addition, a conversation with the GYN  oncologist whom the patient had seen in the past, who felt like the risk  of omentectomy outweighed the benefit for lesions that had been stable  for such a long time and asymptomatic.  It was discussed, however, that  a sampling of the omentum might be reasonable if any palpable disease  was present.  The  patient verbalized understanding of these  risks and has consented to  proceed with a total abdominal hysterectomy with bilateral salpingo-  oophorectomy at [facility] [place] on August 25, 2006 at 7:30  a.m.  She was given a Fleet's PhosphoSoda bowel prep to be administered  one day prior to her surgical procedure.      Elmira J. Adline Peals.      Hal Morales, M.D.  Electronically Signed    EJP/MEDQ  D:  08/22/2006  T:  08/22/2006  Job:  [id number]

## 2010-09-27 NOTE — Consult Note (Signed)
NAME:  [name], [name] L                       ACCOUNT NO.:  [id number]   MEDICAL RECORD NO.:  [id number]                   PATIENT TYPE:  OUT   LOCATION:  GYN                                  [facility]ACILITY[facility]   PHYSICIAN:  John T. Kyla Balzarine, M.D.                 DATE OF BIRTH:  1951/02/03   DATE OF CONSULTATION:  03/29/2003  DATE OF DISCHARGE:                                   CONSULTATION   CHIEF COMPLAINT:  The patient is seen at the request of Vanessa P. Pennie Rushing,  M.D., and [place]. Derrell Lolling, M.D., for recommendations revolving around  management of an omental cake.   HISTORY OF PRESENT ILLNESS:  The patient's history dates to May 2004.  She  had relative amenorrhea and then menorrhagia.  She underwent diagnostic  hysteroscopy with D&C and hysteroscopic resection of a uterine fibroid,  performed by Maris Berger. Pennie Rushing, M.D.  The instillate is not specified in  the operative note, but loop electrocautery was performed.  Subsequently,  the patient was evaluated and found to have thickening in the abdomen.  She underwent a CT of the abdomen and pelvis on September 14 revealing  nodular thickening present in the region of the omentum and there was no  evidence of ascites.  The uterus was lobular and enlarged, compatible with  fibroids.  There were small peritoneal implants throughout the abdominal  cavity.  These were more prominent in the upper abdomen.  She had a CT scan  of the chest that was negative.  She underwent GI evaluation including upper  endoscopy and colonoscopy, both essentially negative.  Mammograms and  localized needle core biopsies bilaterally revealed sclerosing adenosis and  hyalinized fibroadenoma.  Cystoscopy and urine culture were likewise  negative.  On February 15, 2003, she underwent diagnostic laparoscopy.  She  was found to have nodularity involving the omentum and the anterior  abdominal wall.  The tubes and ovaries were stated to be normal.  The 19 cm  portion  of omentum was removed along with other resections of peritoneal  plaques.  Pathology (reviewed elsewhere) reveals scar tissue and fibrosis  with psammoma bodies but no malignant cells.   PAST MEDICAL HISTORY:  Significant for hypertension, hysteroscopy, urethral  surgery and ureteral surgery in 1972 for questionable congenital problem  versus retroperitoneal fibrosis, knee surgery in May 2000.   MEDICATIONS:  Lipitor, fluoxetine, multivitamins, baby aspirin, calcium,  magnesium.   ALLERGIES:  None known.   FAMILY HISTORY:  Lung cancer in her mother but no gynecologic, breast, or  colon malignancy.   PERSONAL AND SOCIAL HISTORY:  Married.  Quit tobacco use in 1978.  Occasional ethanol.   REVIEW OF SYSTEMS:  The patient has healed completely from the surgery.  Other than the following episodes, the patient's review of systems is  negative:  She relates a hospitalization approximately 10-12 years ago for  acute-onset right upper quadrant pain.  She was observed, scanned, and no  firm diagnosis was made.  Pain spontaneously diminished and she has had  three episodes of similar pain over the years.  Etiology was never  determined.  The patient denies fever or chills or abdominal tenderness  following her hysteroscopy.   PHYSICAL EXAMINATION:  VITAL SIGNS:  Weight 222 pounds.  Blood pressure  150/98, pulse 92, respirations 20.  GENERAL:  The patient is alert and oriented x3, in no acute distress.  HEENT:  The ENT is benign.  Clear oropharynx.  CHEST:  Lung fields clear.  CARDIAC:  Regular rhythm.  ABDOMEN:  Obese, soft, and benign, without ascites, mass, or organomegaly.  Healing laparoscopic trocar sites are present without inflammation or  hernia.  There is no ascites or palpable mass.  EXTREMITIES:  Full range of motion without edema and full strength.  PELVIC:  External genitalia and BUS are normal to inspection and palpation.  The uterus and cervix are absent, and vagina is  clear.  Bimanual and  rectovaginal examinations reveal no mass or nodularity.   ASSESSMENT:  This is a frustrating case because of the wide variance in  items on the differential diagnosis.  It should be noted that CA125 value  was normal prior to surgery and she had adequate sampling of omental disease  and abdominal disease, without any histologic diagnosis of malignancy.  Psammoma bodies are fairly nonspecific and are frequently found in the  pelvis as a residua of inflammatory change, endosalpingiosis, or, more  rarely, endometriosis.  It is possible that the patient has had an  inflammatory reaction to the media used for her hysteroscopy.  On the other  hand, isolated psammoma bodies might represent a reaction to occult ovarian  or other peritoneal malignancy.  However, it would be extremely unusual to  see spontaneous regression of sizeable lesions involving the omentum and  upper abdominal peritoneum.   RECOMMENDATIONS:  I called Dr. Claud Kelp to discuss the differential  diagnosis, and he is basically in agreement with the differential diagnosis  and it range of treatments.   I proposed to the patient and her husband that we repeat a CA125 value today  and that the patient be followed with a CT scan in approximately six weeks.  If CA125 today has not normalized, I would repeat this in six weeks.  As  long as we are not seeing progression of intraperitoneal findings or a  rising CA125, I believe it would be reasonable to continue passive  observation; however, I would be inclined to explore the patient for any  indications, including any concerns that the patient might have regarding  malignancy.  I am not entirely clear that making an early diagnosis would  improve her overall survival if she had an advanced mesothelioma or advanced  ovary; however, I would certainly support exploration with TAH/BSO and repeat biopsying of the omentum combined with comprehensive surgical  staging  for an ovarian-like malignancy.                                               John T. Kyla Balzarine, M.D.    JTS/MEDQ  D:  03/29/2003  T:  03/30/2003  Job:  [id number]   cc:   Hal Morales, M.D.  [address], [address]  [place]  [place] [address]  Fax: [phone]   Mikey Bussing  Grayce Sessions, M.D.  [address], [address]  [place]  [place] [address]  Fax: [phone]   Teena Irani. Arlyce Dice, M.D.  [address]  [place]  [place] [address]  Fax: [phone]

## 2010-09-27 NOTE — Op Note (Signed)
NAME:  [name], [name]             ACCOUNT NO.:  [id number]   MEDICAL RECORD NO.:  [id number]          PATIENT TYPE:  AMB   LOCATION:  [facility]                         FACILITY:  [facility]   PHYSICIAN:  Marlowe Kays, M.D.  DATE OF BIRTH:  09/01/1950   DATE OF PROCEDURE:  11/13/2004  DATE OF DISCHARGE:                                 OPERATIVE REPORT   PREOPERATIVE DIAGNOSES:  Torn medial meniscus, left knee.   POSTOPERATIVE DIAGNOSES:  1.  Torn medial meniscus, left knee.  2.  Chondromalacia patella, left knee.   OPERATION:  Left knee arthroscopy with 1) partial medial meniscectomy, 2)  shaving of patella.   SURGEON:  Marlowe Kays, M.D.   ASSISTANT:  Nurse.   ANESTHESIA:  General.   PATHOLOGY AND JUSTIFICATION FOR PROCEDURE:  I performed a prior left knee  arthroscopy on her roughly six years ago in April of 2000 with the excision  of a bucket tear of the medial meniscus, shaving the lateral meniscus and  debridement of a partial ACL tear. She reinjured her left knee following a  right ankle fracture sprain in January. Because of persistent pain and  swelling, I sent her for an MRI of the knee which was performed on October 15, 2004 and showed a posterior horn tear of the medial meniscus. There was felt  to be some myxoid degeneration of the ACL and some medial and patellofemoral  degenerative disease with a small Baker cyst. She was advised that the Baker  cyst will usually resolve with taking care of the intraarticular pathology.   DESCRIPTION OF PROCEDURE:  Satisfactory general anesthesia, pneumatic  tourniquet, leg was esmarched out nonsterilely, thigh stabilizer, Ace wrap  to right leg and knee support. The left leg was prepped from the thigh  stabilizer to ankle with DuraPrep, draped in a sterile field, superomedial  saline inflow. First through an anterolateral portal, the medial compartment  of the knee joint was evaluated. I found a little synovial defect at the mid  third of the medial meniscus with the meniscus not involved. This did emit  the tip of my probe and looking underneath, the meniscus appeared to be  intact so I left this untouched. Posteriorly, she did have a substantial  tear of the medial meniscus confirming the MRI findings. This was resected  back to a stable rim with a combination of baskets and a 3.5 shaver. She had  some grade 1 to 2 chondromalacia of the posterior portion of the medial  tibial plateau and some very minimal wear of the medial femoral condyle. I  could not see any abnormalities of her ACL through this projection. Looking  up in the medial gutter and suprapatellar area, she had grade 2-3/4  chondromalacia of the mid portion of her patella and a little adjacent wear  of the femur. I used a 3.5 shaver to shave this down until smooth and  debride up the superior femur. I then reversed portals. There was a little  redundant type mucous tissue near the posterolateral aspect of the lateral  portion of the ACL which I resected with small  baskets. Her ACL basically  was intact and I took a representative tissue. The lateral joint looked  unremarkable. The knee joint was then irrigated until clear and all fluid  possible removed. The two anterior portals were closed with 4-0 nylon. 20 mL  of 0.5% Marcaine with adrenaline and 4 mg of morphine were then instilled  through the inflap rasp which was removed and  this portal closed with 4-0 nylon as well. Betadine Adaptic dry sterile  dressing were applied. The tourniquet was released. She tolerated the  procedure well and was taken to the recovery room in satisfactory condition  with no known complications.       JA/MEDQ  D:  11/13/2004  T:  11/13/2004  Job:  [id number]

## 2010-09-27 NOTE — H&P (Signed)
NAME:  [name], [name]             ACCOUNT NO.:  [id number]   MEDICAL RECORD NO.:  [id number]          PATIENT TYPE:  AMB   LOCATION:  [facility]                           FACILITY:  [facility]   PHYSICIAN:  Hal Morales, M.D.DATE OF BIRTH:  [birth year]   DATE OF ADMISSION:  08/09/2004  DATE OF DISCHARGE:                                HISTORY & PHYSICAL   HISTORY OF PRESENT ILLNESS:  [name] [name] is a 60 year old married white  female, para 2-0-0-2, who presents for hysteroscopy with resection of  fibroid if present because of metrorrhagia.  For the past year, the patient  has had a history of intermittent vaginal bleeding occurring every 2 weeks,  lasting for approximately 3 days.  During these times, the patient changes  her pad every 1 to 1-1/2 hours, but denies any cramping, just diffuse pelvic  discomfort.  In 2004, the patient also had irregular vaginal bleeding for  which she underwent a hysteroscopy with resection of a submucosal fibroid to  resolve her symptoms.  The patient remained asymptomatic until her current  history of irregularity.  She denies any fever, nausea, vomiting, diarrhea,  or vaginitis symptoms.  A pelvic ultrasound in November of 2005, revealed  approximately five uterine fibroids, the largest of which measuring 4.7 cm.  Her right ovary was within normal limits and her left ovary was not  identified, however, there were no left adnexal masses or free fluid  observed on that study.  A discussion was held with the patient regarding  options for management of her symptoms which included a repeat hysteroscopy  with fibroid resection or hysterectomy and the patient has chosen to proceed  with the former.   PAST MEDICAL HISTORY:   PAST OBSTETRICAL HISTORY:  Gravida 2, para 2-0-0-2.  The patient had all  spontaneous vaginal deliveries.  GYN history; menarche at 60 years old.  Last menstrual period was July 27, 2004.  The patient's bleeding pattern is  as presented in  the history of present illness.  She uses vasectomy as her  method of contraception, denies any history of abnormal Pap smear.  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  She had a normal Pap smear and  mammogram in October of 2005.  Medical history is positive for  peritoneal/omental fibrosis, hypercholesterolemia, TMJ, migraine equivalent,  and left foot injury.   PAST SURGICAL HISTORY:  In 1972, periureteral vascular clip placement.  In  1977 appendectomy.  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  2004  hysteroscopy with submucosal fibroid resection.  2004 diagnostic laparoscopy  for peritoneal/omental fibrosis and psammoma bodies.  The patient denies any  problems with anesthesia nor any history of blood transfusions.   FAMILY HISTORY:  Positive for heart disease, hypertension, lung cancer, and  pancreatic cancer.   HABITS:  The patient does not use tobacco.  The patient consumes alcohol  twice weekly.   CURRENT MEDICATIONS:  1.  Lipitor 10 mg daily.  2.  Prozac 20 mg daily.  3.  Ambien 10 mg at bedtime as needed.  4.  Black Cohosh two tablets twice daily.  5.  Prometrium 100 mg one tablet at bedtime.   ALLERGIES:  No known drug allergies.   REVIEW OF SYSTEMS:  The patient does wear corrective lenses. She has a  stable left lower back nevus and except as mentioned in the history of  present illness, her review of systems is otherwise negative.   PHYSICAL EXAMINATION:  VITAL SIGNS:  Blood pressure 138/80, weight is 231,  height is 5 feet 3-1/2 inches tall.  NECK:  Supple.  There are no masses, thyromegaly, or adenopathy.  HEART:  Regular rate and rhythm, there is no murmur.  LUNGS:  Clear.  There are no wheezes, rales, or rhonchi.  BACK:  No CVA tenderness. The patient does have on her left lower back an  8.5 cm x 5 cm hyperpigmented homogeneous macular nevus.  ABDOMEN:  Bowel sounds are present and is soft without tenderness, guarding,  rebound, or organomegaly.   EXTREMITIES:  Without cyanosis, clubbing, or edema.  PELVIC:  EGBUS is within normal limits.  Vagina is rugous.  Cervix is  nontender without lesions.  Uterus appears upper limits of normal size  without tenderness.  Adnexa without tenderness or masses.  Rectovaginal  without tenderness or masses.   IMPRESSION:  1.  Irregular menses.  2.  Fibroid uterus.   DISPOSITION:  As previously mentioned, it was discussed with the patient the  options for management of her irregular bleeding and the patient has chosen  to proceed with a repeat hysteroscopy with fibroid resection.  The patient  verbalized understanding of the indications for this procedure along with  the risks which include, but are not limited to reaction to anesthesia,  damage to adjacent organs, excessive bleeding, and infection.  The patient  has consented to undergo a repeat hysteroscopy with fibroid resection at  [facility] on August 13, 2004, at 11:30 a.m.      EJP/MEDQ  D:  08/09/2004  T:  08/09/2004  Job:  [id number]

## 2010-09-27 NOTE — H&P (Signed)
NAME:  Marschall, [place] S                       ACCOUNT NO.:  [id number]   MEDICAL RECORD NO.:  [id number]                   PATIENT TYPE:  AMB   LOCATION:  [facility]                                  FACILITY:  [facility]   PHYSICIAN:  Hal Morales, M.D.             DATE OF BIRTH:  [birth year]   DATE OF ADMISSION:  DATE OF DISCHARGE:                                HISTORY & PHYSICAL   DATE OF ADMISSION:  February 15, 2003   HISTORY OF PRESENT ILLNESS:  The patient is a 60 year old white married  female para 2-0-0-2 who presents for surgical evaluation of a CT scan  finding of nodular thickening of the anterior peritoneal cavity worrisome  for omental caking of metastatic disease.  The patient was in her usual  state of good health being followed up for an episode of postmenopausal  bleeding when a screening colonoscopy was ordered.  She was seen by Dr.  Charna Elizabeth and on abdominal examination was felt to have some right lower  quadrant tenderness.  CT scan was ordered for evaluation of the right lower  quadrant tenderness with the following result:  CT of the abdomen showed  clear lung bases.  The liver was normal with no focal abnormality and no  ductal dilatation, no calcified gallstones.  The pancreas, adrenal glands,  and spleen appeared normal.  The kidneys enhanced normally with no evidence  of renal mass or calculi.  No adenopathy was seen.  The abdominal aorta was  normal in caliber.  There was, however, in the anterior abdomen nodular  thickening present with the pattern being very worrisome for early omental  caking.  CT scan of the pelvis showed an enlarged uterus which was lobular  which may be due to fibroid though they felt endometrial carcinoma could not  be excluded.  There was slight prominence of the left ovary with normal  measurements of 3.4 x 2.0 cm.  The right ovary was not visualized.  The  urinary bladder was unremarkable.  There was no fluid within the pelvis.  There  were rectosigmoid colonic diverticula present and there were no  similar lesions seen in the peritoneum of the pelvic region.  Primary  considerations of ovarian carcinoma; GI malignancy such as carcinoma of the  colon or stomach; pancreatic; uterus; or bladder carcinoma were all  considered.  In light of this finding several evaluations were undertaken.  It is of note that the patient underwent a diagnostic hysteroscopy and  resection of uterine myoma on October 19, 2002 with pathology showing a uterine  fibroid, lobulated smooth muscle, and endometrial basalis consistent with  submucosal leiomyoma, and the second sample being endometrium which was  benign and weakly proliferative.  An evaluation of the pelvis with  ultrasound showed an enlarged uterus with multiple fibroids.  The left ovary  contained a simple cyst 1.6 x 1.5 x 1.7 and the right ovary was normal  in  appearance.  This was read as a normal ultrasound with normal appearance of  both ovaries and no evidence of ovarian or adnexal masses.  This was  unchanged from previous ultrasound evaluation which had been done prior to  the patient's surgical procedure.  The patient also underwent a urology  evaluation and cystoscopy with no evidence of bladder tumor.  She underwent  upper endoscopy and colonoscopy all without lesions noted.  She underwent a  chest x-ray which showed no active chest disease.  The cardiac size was at  the upper limits of normal with calcified granuloma versus pulmonary vessel  seen in cross section in the right mid to lower lung zone and degenerative  spondylosis in the lower thoracic spine.   Mammography undertaken on February 09, 2003 showed irregular indeterminate  nodules in the 9 o'clock position in the right breast and the 1 o'clock  position in the left breast for which biopsies were undertaken and the  pathology of each showed fibroadenoma.  CA125 was within normal limits at  9.1 and sedimentation rate  was normal at 14.  In light of the CT scan  findings of a potential metastatic focus without a current primary, the  patient has consented to undergo laparoscopic evaluation of the CT-scan-  described area.   PAST MEDICAL HISTORY:  The patient has a history of depression/anxiety,  hypercholesterolemia, migraine headaches, sinus, allergies.   GYNECOLOGICAL HISTORY:  Episode of perimenopausal abnormal uterine bleeding  shown to be consistent with uterine fibroids on hysteroscopic resection of  fibroid and D&C with normal endometrium.  Since that time the patient has  had normal regular menses.  The last menstrual period was on January 23, 2003.   PAST SURGICAL HISTORY:  The patient had ureteral surgery in 1972, an  appendectomy in 1977, and myomectomy in 2004.   OBSTETRICAL HISTORY:  The patient has had two spontaneous vaginal deliveries  in 1979 and 1981.   MEDICATIONS:  1. Lipitor 20 mg a day.  2. Prozac 20 mg a day.  3. Aspirin 81 mg daily.  4. Calcium 1000 mg a day.  5. Coenzyme Q 10 60 mg a day.  6. Multivitamin.   SOCIAL HISTORY:  The patient is a retired Loss adjuster, chartered.  She is married with two children.  She does not smoke.  She  drinks an occasional glass of wine.  She does not use any illegal drugs.  She does exercise with swimming, gardening, walking, and playing tennis.   FAMILY HISTORY:  Significant for father age 58 with status post coronary  bypass surgery and has a history of hypertension and heart disease.  Her  mother died at age 67 of metastatic lung cancer.  She has two brothers who  are healthy.  A paternal aunt had pancreatic cancer.  There is no known  family history of colon, breast, ovarian, or uterine cancer.   PHYSICAL EXAMINATION:  GENERAL:  The patient is a well-developed white  female in no acute distress.  VITAL SIGNS:  The blood pressure is 128/80, weight is 220 pounds. LUNGS:  Clear.  HEART:  Regular rate and rhythm.   ABDOMEN:  Soft without masses or organomegaly.  EXTREMITIES:  No clubbing, cyanosis, or edema.  PELVIC:  EG/BUS within normal limits.  The vagina is rugous.  The cervix is  upper limits of normal size and nontender to motion.  Adnexa:  No masses.  Rectovaginal:  No masses.   IMPRESSION:  CT scan suggestion of metastatic disease in a patient who is in  good health and without any stigmata of primary malignancy.  A noninvasive  search for gastrointestinal, ovarian, pancreatic, bladder, lung, breast  cancers have all been nonproductive.   DISPOSITION:  A long discussion has been held with the patient and her  husband concerning the next step in evaluation.  Laparoscopy is recommended  as a means by which the area described in the CT scan may be accessed and  she consents with this.  I have consulted with [facility] and Dr. Claud Kelp will assist with the laparoscopic  procedure in an effort to identify the aforementioned area and obtain pelvic  and peritoneal washings and biopsies.  The patient understands that there is  the risk of anesthesia, bleeding, infection,  damage to adjacent organs, and also the risk that no actual diagnosis will  be obtained at the end of the procedure.  She still wishes to proceed with  the above-noted intervention and will have this done at [facility] on  February 15, 2003.                                               Hal Morales, M.D.    VPH/MEDQ  D:  02/14/2003  T:  02/14/2003  Job:  [id number]

## 2010-09-27 NOTE — Discharge Summary (Signed)
NAME:  [name] [name], [name] [name]             ACCOUNT NO.:  [id number]   MEDICAL RECORD NO.:  [id number]          PATIENT TYPE:  INP   LOCATION:  1423                         [facility]ACILITY[facility]   PHYSICIAN:  Hal Morales, M.D.DATE OF BIRTH:  03-15-1951   DATE OF ADMISSION:  08/25/2006  DATE OF DISCHARGE:  08/27/2006                               DISCHARGE SUMMARY   DISCHARGE DIAGNOSES:  1. Symptomatic uterine fibroids.  2. Endometriosis.  3. Abnormal uterine bleeding.  4. Pelvic pain.  5. Pelvic adhesions.   OPERATION:  On the date of admission, the patient underwent a total  abdominal hysterectomy with bilateral salpingo-oophorectomy and lyses of  adhesions, tolerating all procedures well.  The patient also had  peritoneal washings which were collected and also sent to pathology  along with her uterus, cervix, bilateral tubes and ovaries.   HISTORY OF PRESENT ILLNESS:  Ms Eddington is a 60 year old married white  female Para 2-0-1-2 who presents for a total abdominal hysterectomy with  bilateral salpingo-oophorectomy because of symptomatic uterine fibroids,  abnormal bleeding and pelvic pain.  Please see patient's dictated  history and physical examination for details.   PREOPERATIVE PHYSICAL EXAMINATION:  Blood pressure 130/82, weight is  179.5, height 5 feet 3.5 inches tall.  GENERAL EXAMINATION:  Within normal limits.  PELVIC EXAMINATION:  External genitalia, Bartholin, urethral, Skene's  glands are within normal limits.  VAGINA:  Atrophic.  CERVIX:  Nontender without lesions.  UTERUS:  Appears upper limits of normal size, shape and consistency.  There is no tenderness.  ADNEXA:  No tenderness or masses.  Rectovaginal:  No tenderness or masses.   HOSPITAL COURSE:  On the date of admission, the patient underwent  aforementioned procedures, tolerating them all well.  Postoperative  hemoglobin was 10.5 (preoperative hemoglobin 14.3).  The patient's  postoperative course was marked  by patient having 24 hours of  hypotension with her systolic blood pressures ranging between 90 to 100  and diastolics 40 to 50.  This was believed to be secondary to patient's  volume depletion (patient underwent a Fleet Phospho-Soda bowel prep  prior to her surgery).  However, once this was restored, the patient's  blood pressures normalized.  By postoperative day #2, the patient had  resumed bowel and bladder function, however, she began to complain of a  roaring sensation in her left ear and on occasion being off balance when  she ambulated.  It was concluded the patient should have this evaluated  as an outpatient, and therefore, arrangements were made for patient to  followup with an ear, nose and throat specialist.  Having received the  maximum benefit of her hospital stay, the patient was therefore  discharged home.   DISCHARGE LABS:  CBC:  WBC 6.6, hemoglobin 9.8, hematocrit 28.4,  platelets 258.  Basic metabolic panel was essentially normal.   DISCHARGE MEDICATIONS:  The patient was directed to her home medication  reconciliation form to follow instructions indicated there.  She was  further advised to take Colace 100 mg twice daily until her bowel  movements are normal.  Ibuprofen 600 mg with food every six  hours for  three days, then as needed for pain.  Phenergan 25 mg every six hours as  needed for nausea.  Iron one tablet twice a day for six weeks.   FOLLOWUP:  The patient is scheduled for a 6-week followup with Dr.  Pennie Rushing on Oct 01, 2006, at 10:30 a.m.  She also was scheduled at  [facility] on September 01, 2006, to have her staples removed.  Additionally, the patient was given an appointment to see ear, nose and  throat specialist Dr. Narda Bonds on the day of discharge August 27, 2006, at 12:30 p.m. for an evaluation of her left ear and equilibrium  complaints.   DISCHARGE INSTRUCTIONS:  The patient was given a copy of [facility]  postoperative instruction sheet.  She was further  advised to avoid driving for two weeks, heavy lifting for four weeks and  of course for six weeks that she may shower, may walk up steps, should  increase her activity slowly.   DIET:  The patient's diet was without restrictions.   FINAL PATHOLOGY:  Peritoneal washing, ThinPrep and cell block:  Reactive  mesothelial cells were present.  Uterus hysterectomy:  Benign cervix  with mild chronic cervicitis.  Benign proliferative endometrium,  leiomyomata, serosal endometriosis.  Bilateral ovaries and fallopian  tubes resection:  Focal endometriosis of fallopian tube, hemorrhagic  ovarian follicular cyst, fibrous adhesions, and no malignancy was  identified.   This ends the dictation of the discharge summary on [name] [name] Albano.      [place] J. Adline Peals.      Hal Morales, M.D.  Electronically Signed    EJP/MEDQ  D:  09/18/2006  T:  09/18/2006  Job:  [id number]

## 2010-09-27 NOTE — Op Note (Signed)
NAME:  [name] [name], [name] [name]             ACCOUNT NO.:  [id number]   MEDICAL RECORD NO.:  [id number]          PATIENT TYPE:  INP   LOCATION:  0009                         FACILITY:  [facility]   PHYSICIAN:  Hal Morales, M.D.DATE OF BIRTH:  01-20-1951   DATE OF PROCEDURE:  08/25/2006  DATE OF DISCHARGE:                               OPERATIVE REPORT   PREOPERATIVE DIAGNOSES:  1. Symptomatic uterine fibroids.  2. Abnormal uterine bleeding.  3. Pelvic pain.   POSTOPERATIVE DIAGNOSES:  1. Symptomatic uterine fibroids.  2. Abnormal uterine bleeding.  3. Pelvic pain.  4. Possible endometriosis.  5. Pelvic adhesions.   PROCEDURE:  1. Total abdominal hysterectomy.  2. Bilateral salpingo-oophorectomy with lysis of adhesions.   SURGEON:  Hal Morales, M.D.   ASSISTANTMarquis Lunch. Powell, P.A.-C.   ANESTHESIA:  General orotracheal anesthesia.   ESTIMATED BLOOD LOSS:  Was 375 mL.   COMPLICATIONS:  None.   FINDINGS:  The uterus was enlarged by several myomata, the largest of  which was approximately 5 cm on the left lateral fundus.  The tubes and  ovaries were normal bilaterally except for small white excrescences on  the fallopian tube on the right and filmy and dense adhesions of the  left fallopian tube to the sigmoid colon.  The ovaries contained no  excrescences and were small and normal for the post-menopausal state.  There was an area of bluish discoloration of the peritoneum on the  anterior surface of the uterus, consistent with endometriosis.  There  was likewise firmness of the right uterosacral ligament with an apparent  peritoneal window between the uterosacral ligaments, all consistent with  endometriosis.   SPECIMENS TO PATHOLOGY:  Uterus, cervix, right and left tubes and  ovaries and peritoneal washings.   DESCRIPTION OF PROCEDURE:  The patient was taken to the operating room  after appropriate identification, and placed on the operating room  table.  She had  received 2 grams of cephazolin on call to the operating  room.  She was placed on the operating room table and after the  attainment of adequate general anesthesia, with the patient in the  supine and frog-leg position, the abdomen, perineum and vagina were  prepped with multiple layers of Betadine.  A Foley catheter was inserted  into the bladder and connected to straight drainage.  The abdomen was  draped as a sterile field.  The suprapubic region was injected with 20  mL of 0.25% Marcaine and a suprapubic incision was made.  The abdomen  was opened in layers and the peritoneum entered.  The upper abdomen was  evaluated and no hepatosplenomegaly or peri-aortic lymphadenopathy could  be appreciated.  Pelvic washings were obtained.  The self-retaining  O'Connor/O'Sullivan retractor was placed in the peritoneal cavity and  the bowel packed cephalad.  The uterus was elevated with Kelly clamps at  each cornual region.  The left round ligament was identified, suture  ligated and incised and that incision taken anteriorly on the anterior  leaf of the broad ligament and caudad.  The utero-ovarian ligament was  then identified, clamped, cut and tied  with a free tie and suture  ligated.  A similar procedure was carried out with the round ligament on  the right side, and the utero-ovarian ligament.  The bladder was sharply  dissected off the anterior cervix.  The uterine artery on the right and  the left side was skeletonized and the left uterine artery and vein  clamped, cut and suture ligated.  A similar procedure was carried out  with the uterine vessels on the opposite side.  The parametrial tissues  were then clamped, cut and suture ligated and the uterus excised from  the upper cervix.  The paracervical tissues and uterosacral ligaments  were then successively clamped, cut and suture ligated and the sutures  on the uterosacral ligaments held.  The vaginal angles were clamped, cut  and suture  ligated and the cervix removed from the operative field.  The  vaginal cuff was closed with figure-of-eight sutures of #0 Vicryl.  The  sutures at the vaginal angles were then reinforced on either side to  achieve adequate hemostasis.  A suture of #2-0 Vicryl was used to  achieve hemostasis on the bladder.  Copious irrigation was carried out.  Hemostasis was noted to be adequate.  The sutures holding the vaginal  angles were tied together right to left, and the sutures holding the  uterosacrals tied together right to left.  The left tube and ovary were  sharply dissected off the underlying sigmoid colon.  The left ureter was  identified and the infundibular pelvic ligament isolated, clamped, cut  and tied with a free tie and suture ligated.  A similar procedure was  carried out on the right side and the right tube and ovary were removed  from the operative field after the left had been removed.  Copious  irrigation was carried out and hemostasis noted to be adequate.  At that  time the fat pad around the large bowel was evaluated and none of the  lesions that had been seen on the omentum, which had been removed in  2004, were noted.  The abdominal peritoneum was then closed with a  running suture of #2-0 Vicryl, after all instruments and sponges were  removed from the peritoneal cavity.  The rectus muscles were made  hemostatic with Bovie cautery and irrigated.  The rectus fascia was  closed from each apex to the midline and tied in the midline with  sutures of #0 Vicryl.  The subcutaneous tissue was irrigated and made  hemostatic with Bovie cautery and skin staples applied to the skin  incision.  A sterile dressing was applied.   The patient was awakened from general anesthesia and was taken to the  recovery room in satisfactory condition, having tolerated the procedure  well.  The sponge and instrument counts were correct.      Hal Morales, M.D. Electronically Signed      VPH/MEDQ  D:  08/25/2006  T:  08/25/2006  Job:  [id number]

## 2010-09-27 NOTE — Consult Note (Signed)
NAME:  [name] [name], [name] [name]                       ACCOUNT NO.:  [id number]   MEDICAL RECORD NO.:  [id number]                   PATIENT TYPE:  OUT   LOCATION:  GYN                                  FACILITY:  [facility]   PHYSICIAN:  John T. Kyla Balzarine, M.D.                 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   HISTORY OF PRESENT ILLNESS:  In May 2004 the patient underwent diagnostic  hysteroscopy with D&C and hysteroscopic resection of a uterine fibroid using  loop electrocautery.  In September she had CT of the abdomen and pelvis  revealing nodular thickening present in the region of the omentum without  evidence of ascites.  She underwent negative CT scanning of the chest and  eventually underwent diagnostic laparoscopy on February 15, 2003.  She was  found to have nodularity involving omentum and anterior abdominal wall with  normal tubes and ovaries.  A 19 cm portion of omentum was removed along with  other resections of peritoneal plaques.  Pathology revealed scar tissue and  fibrosis with psammoma bodies but no malignant cells.   Past history, personal and social history, family history, and review of  systems are otherwise unchanged from those recorded during my intake  evaluation on March 29, 2003.   The patient has several CA125 values and CT scans for review.   PHYSICAL EXAMINATION:  VITAL SIGNS:  Stable and afebrile as recorded in the  clinic flow sheet.  Blood pressure 150/90, weight 225 pounds.  GENERAL:  The patient is alert and oriented x3, in no acute distress.  LYMPHATIC:  There is no pathologic lymphadenopathy.  BACK:  There is no back or CVA tenderness.  ABDOMEN:  Soft and benign without ascites, mass, or organomegaly.  Laparoscopic trocar sites well-healed without tenderness or  hernia.  EXTREMITIES:  Full range of motion without edema.  PELVIC:  External genitalia and BUS are normal to inspection and palpation.  The bladder and urethra are well-supported, and there are no vaginal  lesions.  Cervix mobile without lesions.  Bimanual and rectovaginal  examinations are limited because of habitus but reveal no cul-de-sac  nodularity or adnexal pathology.  The uterus appears upper limits size.   LABORATORY DATA:  CA125 values in November and December were less than 20.  CT of the abdomen and pelvis performed recently is reviewed and there is  decreased prominence of the omental cake and peritoneal implants.  A  small, relatively simple ovarian cyst is seen.  There is no free fluid.   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   PLAN AND RECOMMENDATIONS:  The patient will return for a follow-up CT and  CA125 value in approximately three months;  if these are stable/improving, I  would recommend that she be followed by Dr. Pennie Rushing for routine annual  examinations and unless she develops symptoms, we can simply cease multiple  scans.                                               John T. Kyla Balzarine, M.D.    JTS/MEDQ  D:  05/31/2003  T:  06/01/2003  Job:  [id number]   cc:   Hal Morales, M.D.  Fax: [phone]   Angelia Mould. Derrell Lolling, M.D.  [address], [address]  [place]  [place] [address]  Fax: [phone]   Telford Nab, R.N.  [phone] [address]  [place], [place] [address]

## 2010-09-27 NOTE — H&P (Signed)
NAME:  [place], [place]                         ACCOUNT NO.:  [id number]   MEDICAL RECORD NO.:  [id number]                   PATIENT TYPE:   LOCATION:                                       FACILITY:   PHYSICIAN:  Hal Morales, M.D.             DATE OF BIRTH:  Jun 17, 1950   DATE OF ADMISSION:  DATE OF DISCHARGE:                                HISTORY & PHYSICAL   HISTORY OF PRESENT ILLNESS:  The patient is a 60 year old married white  female, para 2-0-1-2, with a history of uterine fibroids who presents for  hysteroscopy with D&C because of menorrhagia.  Since January of 2004 the  patient reports daily vaginal bleeding, requiring four super tampons per  day.  She denies any vaginitis symptoms, fever, urinary tract symptoms, back  pain, or pelvic pain; however, does report occasional right lower quadrant  discomfort.  An endometrial biopsy in March of 2004 revealed degenerative  secretory type endometrium and a pelvic ultrasound at that same time showed  multiple uterine fibroids and two areas within the endometrial canal  (questionable submucosal fibroids) measuring 6.7 mm and 1.6 cm respectively.  The patient was placed on a regimen of Provera 30 mg daily which was  increased to 40 mg daily; however, neither dosage resolved her symptoms.  The patient has therefore consented to undergo hysteroscopy with D&C in an  effort to further evaluate and manage her symptoms.   PAST MEDICAL HISTORY:  1. Positive for fibroids.  2. Hypercholesterolemia.  3. Mild blood pressure elevation.  4. Anxiety.  5. 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  6. TMJ.  7. Congenital nevus.   OB HISTORY:  Gravida 3, para 2-0-1-2.   GYN HISTORY:  Menarche 60 years old.  The patient's menstrual periods were  regular until January 2004.  See history of present illness.  The patient  uses vasectomy as her method of contraception.  The patient has a history of  Herpes simplex type 2.  She denies any history of abnormal Pap smear.  Her  last normal Pap smear was March 2004.  Her last normal mammogram was  November 2003.   PAST SURGICAL HISTORY:  1. 1972, right kidney surgery.  2. 1977, appendectomy.  3. 1985, laparoscopic surgery for pelvic pain in which the patient was found     to have adhesions and endometriosis.  4. 2000, right knee ACL tear repair.  The patient denies any problems with     anesthesia or blood transfusions.   FAMILY HISTORY:  Positive for lung cancer, hypertension, and heart disease.   SOCIAL HISTORY:  The patient is married and she is retired from Agricultural consultant and  Research officer, political party.   HABITS:  She does not use tobacco.  She occasionally consumes alcohol.   CURRENT MEDICATIONS:  1. Multivitamins with iron one daily.  2. Provera 40 mg daily.  3.  Fluoxetine 20 mg daily.  4. Aleve as needed.   ALLERGIES:  The patient has no known drug allergies, though CODEINE does on  occasion cause her nausea and vomiting.   REVIEW OF SYSTEMS:  Positive for right neck strain.  The patient does wear  glasses.  She is prone to claustrophobia.   PHYSICAL EXAMINATION:  VITAL SIGNS:  Blood pressure is 140/82, weight is  226, height is 5 feet 3-1/2 inches tall.  NECK:  Supple.  There are no masses.  There is no adenopathy or thyromegaly.  HEART:  Regular rate and rhythm.  There is no murmur.  LUNGS:  Clear to auscultation.  There are no wheezes, rales, or rhonchi.  BACK:  No CVA tenderness.  The patient does have a congenital nevus along  the left aspect measuring 6.5 x 4.5 cm.  ABDOMEN:  Bowel sounds are present.  Soft without tenderness, guarding,  rebound, or organomegaly.  EXTREMITIES:  Without clubbing, cyanosis, or edema.  PELVIC:  EGBUS is within normal limits.  Vagina is normal.  Cervix is  nontender without lesions.  Uterus is upper limits of normal size without  tenderness.  Adnexa without tenderness or masses.  Rectovaginal exam  without  tenderness or masses.   LABORATORY DATA:  The patient's hemoglobin on Sep 19, 2002 was 11.7.   IMPRESSION:  1. Menorrhagia.  2. Thickened endometrium.  3. Uterine fibroids.   DISPOSITION:  A discussion was held with the patient regarding the  implication for her procedures along with their risks, which include, but  are not limited to, reaction to anesthesia, damage to adjacent organs,  infections, and bleeding.  The patient has read the ACOG brochure on  hysteroscopy and D&C and has consented to undergo these procedures at  [facility] on October 18, 2002 at 1:45 p.m.        [place]mira J. Adline Peals.                    Hal Morales, M.D.    EJP/MEDQ  D:  10/12/2002  T:  10/12/2002  Job:  [id number]

## 2010-09-27 NOTE — Op Note (Signed)
NAME:  [name] [name], [name] [name]                       ACCOUNT NO.:  [id number]   MEDICAL RECORD NO.:  [id number]                   PATIENT TYPE:  AMB   LOCATION:  ENDO                                 FACILITY:  [facility]   PHYSICIAN:  Anselmo Rod, M.D.               DATE OF BIRTH:  1950-07-02   DATE OF PROCEDURE:  02/06/2003  DATE OF DISCHARGE:  02/06/2003                                 OPERATIVE REPORT   PROCEDURE:  Esophagogastroduodenoscopy with biopsies.   ENDOSCOPIST:  Anselmo Rod, M.D.   INSTRUMENT USED:  Olympus video panendoscope.   INDICATIONS FOR PROCEDURE:  A 60 year old white female undergoing EGD. The  patient had a history of epigastric pain and nonsteroidal use in the past.  The patient also found to have omental caking on a recent CT, question  malignancy, rule out gastric masses, polyps, etc.   PREPROCEDURE PREPARATION:  Informed consent was procured from the patient.  The patient fasted for eight hours prior to the procedure.   PREPROCEDURE PHYSICAL:  The patient had stable vital signs. Neck supple.  Chest clear to auscultation. S1, S2 regular. Abdomen soft with normal bowel  sounds.   DESCRIPTION OF PROCEDURE:  The patient was placed in the left lateral  decubitus position, sedated with 60 mg of Demerol and 6 mg of Versed  intravenously. Once the patient was adequately sedated and maintained on low  flow oxygen and continuous cardiac monitoring, the Olympus video  panendoscope was advanced with the mouthpiece over the tongue into the  esophagus under direct vision. The esophagus appeared normal with no  evidence of ring, stricture, masses esophagitis or Barrett'[name] mucosa. The  scope was then advanced into the stomach. Multiple nodular lesions were seen  in the mid body and the antrum with  central ulceration. These were biopsied  for pathology. No frank ulcers or masses were present. Linear superficial  ulcerations were noticed in the duodenal bulb. Two of  them were seen. These  were about 5-6 mm in length and seemed to involve the duodenal bulb only.  The small bowel distal to the bulb appeared normal. There was no outlet  obstruction. The patient tolerated the procedure well without complications.  Retroflexion in the high cardia revealed no abnormalities.   IMPRESSION:  1. Normal appearing esophagus.  2. Nodular lesions in the mid body and antrum with central ulceration.     Biopsy for pathology.  3. Superficial linear ulcer in duodenal bulb, biopsies done.  4. Normal small bowel distal to the bulb.   RECOMMENDATIONS:  1. Await pathology results.  2.     Discontinue all nonsteroidals.  3. Trial of over the counter Prilosec as well as his H2 blockers.  4. Outpatient followup in the next two weeks and earlier if need be.  Anselmo Rod, M.D.    JNM/MEDQ  D:  02/07/2003  T:  02/08/2003  Job:  [id number]   cc:   Tammy R. Collins Scotland, M.D.  [address]  [place]  [place] [address]  Fax: [phone]   Hal Morales, M.D.  [address], [address]  [place]  [place] [address]  Fax: [phone]

## 2010-09-27 NOTE — Op Note (Signed)
NAME:  [place], [place]             ACCOUNT NO.:  [id number]   MEDICAL RECORD NO.:  [id number]          PATIENT TYPE:  AMB   LOCATION:  SDC                           FACILITY:  [facility]   PHYSICIAN:  Hal Morales, M.D.DATE OF BIRTH:  [birth year]   DATE OF PROCEDURE:  08/13/2004  DATE OF DISCHARGE:                                 OPERATIVE REPORT   PREOPERATIVE DIAGNOSES:  1.  Perimenopausal bleeding.  2.  Uterine fibroids.   POSTOPERATIVE DIAGNOSES:  1.  Perimenopausal bleeding.  2.  Uterine fibroids.   OPERATION:  Hysteroscopy, dilatation and curettage, and suction evacuation  of uterine contents.   SURGEON:  Hal Morales, M.D.   ANESTHESIA:  General orotracheal.   ESTIMATED BLOOD LOSS:  Less than 10 mL.   COMPLICATIONS:  None.   FINDINGS:  The uterus sounded to 9 cm.  At the time of hysteroscopy, there  were no specific intrauterine lesions, specifically no submucosal fibroids.   PROCEDURE:  The patient was taken to the operating room after appropriate  identification and placed on the operating table.  After the attainment of  adequate anesthesia, she was placed in lithotomy position.  The perineum and  vagina were prepped with multiple layers of Betadine and a red Robinson  catheter used to empty the bladder.  The perineum was draped as sterile  field.  A single-tooth tenaculum placed on the anterior cervix.  A  paracervical block was achieved with a total of 10 mL of 2% Xylocaine in the  5  and 7 o'clock positions.  The cervix was dilated to accommodate an  endocervical curette, and this was used to curette the endocervix for a  specimen.  The cervix was then dilated to accommodate the diagnostic  hysteroscope, and this was used to observe and document the above-noted  findings.  The sharp curette was then used to curette all quadrants of the  uterus.  The hysteroscope was replaced and a fair amount of shaggy  intrauterine material was noted.  A 7 mm suction  catheter was then used to  suction evacuate this material with the subsequent visualization of the  uterus as clear and without any lesions.  The hysteroscope was then removed  and all instruments removed from the vagina. The uterus was sounded to 9 cm  before beginning dilatation.   SPECIMENS TO PATHOLOGY:  Endocervical curettings and endometrial curettings,  rule out endometrial polyp.   The patient was awakened from general anesthesia and taken to the recovery  room in satisfactory condition having tolerated the procedure well, with  sponge and instrument counts correct.      VPH/MEDQ  D:  08/13/2004  T:  08/13/2004  Job:  [id number]

## 2011-01-20 ENCOUNTER — Other Ambulatory Visit: Payer: Self-pay | Admitting: Obstetrics and Gynecology

## 2011-01-20 DIAGNOSIS — Z1231 Encounter for screening mammogram for malignant neoplasm of breast: Secondary | ICD-10-CM

## 2011-01-28 ENCOUNTER — Ambulatory Visit
Admission: RE | Admit: 2011-01-28 | Discharge: 2011-01-28 | Disposition: A | Discharge status: Home / Self Care | Payer: 59 | Source: Ambulatory Visit | Attending: Obstetrics and Gynecology | Admitting: Obstetrics and Gynecology

## 2011-01-28 DIAGNOSIS — Z1231 Encounter for screening mammogram for malignant neoplasm of breast: Secondary | ICD-10-CM

## 2011-07-15 ENCOUNTER — Ambulatory Visit: Payer: Self-pay | Admitting: Obstetrics and Gynecology

## 2011-08-14 ENCOUNTER — Encounter: Payer: Self-pay | Admitting: *Deleted

## 2011-10-14 ENCOUNTER — Encounter: Payer: 59 | Admitting: Cardiology

## 2011-10-22 ENCOUNTER — Encounter: Payer: 59 | Admitting: Cardiology

## 2011-12-05 ENCOUNTER — Ambulatory Visit (INDEPENDENT_AMBULATORY_CARE_PROVIDER_SITE_OTHER): Payer: BC Managed Care – PPO | Admitting: Cardiology

## 2011-12-05 ENCOUNTER — Encounter: Payer: Self-pay | Admitting: Cardiology

## 2011-12-05 VITALS — BP 141/92 | HR 53 | Ht 64.0 in | Wt 245.0 lb

## 2011-12-05 DIAGNOSIS — E669 Obesity, unspecified: Secondary | ICD-10-CM

## 2011-12-05 DIAGNOSIS — E785 Hyperlipidemia, unspecified: Secondary | ICD-10-CM

## 2011-12-05 DIAGNOSIS — I10 Essential (primary) hypertension: Secondary | ICD-10-CM

## 2011-12-05 NOTE — Patient Instructions (Signed)
Work on a Mediterranean diet and focus on portion control  Continue your other medications

## 2011-12-07 DIAGNOSIS — I10 Essential (primary) hypertension: Secondary | ICD-10-CM | POA: Insufficient documentation

## 2011-12-07 DIAGNOSIS — E785 Hyperlipidemia, unspecified: Secondary | ICD-10-CM | POA: Insufficient documentation

## 2011-12-07 DIAGNOSIS — E669 Obesity, unspecified: Secondary | ICD-10-CM | POA: Insufficient documentation

## 2011-12-07 NOTE — Assessment & Plan Note (Signed)
We discussed appropriate weight loss strategies. She is to continue with her aerobic activity. I recommended a diet based on the Mediterranean diet with restriction in simple carbohydrates and reduction portion sizes. I'll plan on followup again in one year.

## 2011-12-07 NOTE — Progress Notes (Signed)
[name] Date of Birth: October 31, 1950 Medical Record #[id number]  History of Present Illness: [name] [name] is seen to establish cardiac care. She is a former patient of Dr. Deborah Chalk. I have cared for her father [name] for a number of years. She has a history of morbid obesity. She was evaluated in November of 2011 with a stress Cardiolite study which was normal. She also had an echocardiogram which demonstrated mild LVH and mild biatrial enlargement but was otherwise normal. She lives much of the year in the [place]. She has difficulty losing weight. She does try to stay active swimming, biking, and walking. She does note some chronic mild shortness of breath with exertion. Her blood pressures have been normal at home. She does have hypertension managed with bisoprolol HCT and hypercholesterolemia managed with atorvastatin.  Current Outpatient Prescriptions on File Prior to Visit  Medication Sig Dispense Refill  . aspirin 81 MG tablet Take 81 mg by mouth daily.      Marland Kitchen atorvastatin (LIPITOR) 20 MG tablet Take 20 mg by mouth daily.      . bisoprolol-hydrochlorothiazide (ZIAC) 10-6.25 MG per tablet Take 1 tablet by mouth daily.      . diclofenac (VOLTAREN) 75 MG EC tablet Take 75 mg by mouth 2 (two) times daily.      Marland Kitchen estradiol (ESTRACE) 0.5 MG tablet Take 0.5 mg by mouth 2 (two) times daily.      . Eszopiclone (ESZOPICLONE) 3 MG TABS Take 3 mg by mouth at bedtime. Take immediately before bedtime      . FLUoxetine (PROZAC) 20 MG capsule Take 20 mg by mouth daily.        No Known Allergies  Past Medical History  Diagnosis Date  . Hypertension   . Hyperlipidemia   . Obesity   . Chronic anxiety     Past Surgical History  Procedure Date  . Low back 2010    x3  . Appendectomy 1978    History  Smoking status  . Never Smoker   Smokeless tobacco  . Not on file    History  Alcohol Use  . Yes    Family History  Problem Relation Age of Onset  . Lung cancer Mother    . Heart disease Father   . HIV Brother     Review of Systems: As noted in history of present illness.  All other systems were reviewed and are negative.  Physical Exam: BP 141/92  Pulse 53  Ht 5\' 4"  (1.626 m)  Wt 245 lb (111.131 kg)  BMI 42.05 kg/m2 She is a morbidly obese white female in no acute distress.The patient is alert and oriented x 3.  The mood and affect are normal.  The skin is warm and dry.  Color is normal.  The HEENT exam reveals that the sclera are nonicteric.  The mucous membranes are moist.  The carotids are 2+ without bruits.  There is no thyromegaly.  There is no JVD.  The lungs are clear.  The chest wall is non tender.  The heart exam reveals a regular rate with a normal S1 and S2.  There are no murmurs, gallops, or rubs.  The PMI is not displaced.   Abdominal exam reveals good bowel sounds.  There is no guarding or rebound.  There is no hepatosplenomegaly or tenderness.  There are no masses.  Exam of the legs reveal no clubbing, cyanosis, or edema.  The legs are without rashes.  The distal pulses  are intact.  Cranial nerves II - XII are intact.  Motor and sensory functions are intact.  The gait is normal.  LABORATORY DATA: ECG today demonstrates sinus bradycardia with a rate of 53 beats per minute. It is otherwise normal.  Assessment / Plan:

## 2011-12-07 NOTE — Assessment & Plan Note (Signed)
I think she is on appropriate therapy with bisoprolol HCT. She needs to focus her efforts on weight loss.

## 2012-01-13 ENCOUNTER — Encounter (INDEPENDENT_AMBULATORY_CARE_PROVIDER_SITE_OTHER): Payer: Self-pay | Admitting: General Surgery

## 2012-01-13 ENCOUNTER — Ambulatory Visit (INDEPENDENT_AMBULATORY_CARE_PROVIDER_SITE_OTHER): Payer: BC Managed Care – PPO | Admitting: General Surgery

## 2012-01-13 VITALS — BP 144/88 | HR 60 | Temp 97.3°F | Resp 18 | Ht 63.5 in | Wt 245.8 lb

## 2012-01-13 DIAGNOSIS — Z1231 Encounter for screening mammogram for malignant neoplasm of breast: Secondary | ICD-10-CM

## 2012-01-13 DIAGNOSIS — E66813 Obesity, class 3: Secondary | ICD-10-CM

## 2012-01-13 DIAGNOSIS — Z6841 Body Mass Index (BMI) 40.0 and over, adult: Secondary | ICD-10-CM

## 2012-01-13 NOTE — Patient Instructions (Signed)
We will start the work-up 

## 2012-01-14 ENCOUNTER — Encounter (INDEPENDENT_AMBULATORY_CARE_PROVIDER_SITE_OTHER): Payer: Self-pay | Admitting: General Surgery

## 2012-01-14 NOTE — Progress Notes (Signed)
Patient ID: [place] S Negron, female   DOB: 30-Oct-1950, 61 y.o.   MRN: [id number]  Chief Complaint  Patient presents with  . Other    initial bariatric-lap band    HPI [place] S Moxley is a 61 y.o. female.   HPI 61 year old Caucasian female referred by Dr. Dewain Penning for evaluation of weight loss surgery. The patient has struggled all of her adult life for her excess weight. Despite numerous attempts for sustained weight loss, the patient has been unsuccessful. She has tried it can site, [employer], Nutrisystem, Slim fast, Optifast, Meridia, as well as phentermine. She was most successful with phentermine when she lost up to 70 pounds. However she regained all of it back. She states that she is interested in weight loss surgery because she wants to improve the overall quality of her life.  Past Medical History  Diagnosis Date  . Hypertension   . Hyperlipidemia   . Obesity   . Chronic anxiety   . Arthritis   . Cancer     melanoma    Past Surgical History  Procedure Date  . Low back 2010    x3  . Appendectomy 1978  . Abdominal hysterectomy   . Right ureter surgery     in college    Family History  Problem Relation Age of Onset  . Lung cancer Mother   . Cancer Mother     lung  . Heart disease Father   . HIV Brother     Social History History  Substance Use Topics  . Smoking status: Former Smoker    Quit date: 05/12/1976  . Smokeless tobacco: Not on file  . Alcohol Use: Yes    No Known Allergies  Current Outpatient Prescriptions  Medication Sig Dispense Refill  . aspirin 81 MG tablet Take 81 mg by mouth daily.      Marland Kitchen atorvastatin (LIPITOR) 20 MG tablet Take 20 mg by mouth daily.      . bisoprolol-hydrochlorothiazide (ZIAC) 10-6.25 MG per tablet Take 1 tablet by mouth daily.      Marland Kitchen buPROPion (WELLBUTRIN SR) 150 MG 12 hr tablet Take 150 mg by mouth 2 (two) times daily.      . diclofenac (VOLTAREN) 75 MG EC tablet Take 75 mg by mouth 2 (two) times daily.       Marland Kitchen estradiol (ESTRACE) 0.5 MG tablet Take 0.5 mg by mouth 2 (two) times daily.      . Eszopiclone (ESZOPICLONE) 3 MG TABS Take 3 mg by mouth at bedtime. Take immediately before bedtime        Review of Systems Review of Systems  Constitutional: Negative for fever, activity change, appetite change and unexpected weight change.  HENT: Positive for neck pain. Negative for hearing loss and postnasal drip.   Eyes: Negative for photophobia and visual disturbance.       +glasses  Respiratory: Negative for chest tightness, shortness of breath and wheezing.   Cardiovascular: Negative for chest pain, palpitations and leg swelling.       Denies CP, SOB, PND, orthopnea, snoring, some DOE with 1 flight of stairs; cardiologist Dr Peter Swaziland  Gastrointestinal: Negative for nausea, vomiting, abdominal pain, diarrhea and constipation.       Denies reflux; has had egd & colonoscopy by Dr Loreta Ave about 10 yrs ago - normal per pt  Genitourinary: Negative for frequency and difficulty urinating.       Had h/o uterine bleeding, underwent TAHBSO for fibroids/bleeding; on HHRT; G3P2; sometimes has  bladder control issues - cough, sneezing - will leak urine.   Musculoskeletal: Positive for back pain.       B/l knee, hip pain; has seen Dr Newell Coral for back pain; had 3 ruptured discs requiring surgery  Skin: Negative for color change and pallor.  Neurological: Negative for tremors, seizures, syncope, light-headedness and headaches.       Denies TIAs/ amaurosis fugax  Psychiatric/Behavioral: Negative for hallucinations and confusion.    Blood pressure 144/88, pulse 60, temperature 97.3 F (36.3 C), temperature source Temporal, resp. rate 18, height 5' 3.5" (1.613 m), weight 245 lb 12.8 oz (111.494 kg).  Physical Exam Physical Exam  Vitals reviewed. Constitutional: She is oriented to person, place, and time. She appears well-developed and well-nourished. No distress.  HENT:  Head: Normocephalic and atraumatic.    Right Ear: External ear normal.  Left Ear: External ear normal.  Eyes: Conjunctivae and EOM are normal. No scleral icterus.  Neck: Normal range of motion. Neck supple. No tracheal deviation present. No thyromegaly present.  Cardiovascular: Normal rate, regular rhythm, normal heart sounds and intact distal pulses.   Pulmonary/Chest: Effort normal and breath sounds normal. No respiratory distress. She has no wheezes.  Abdominal: Soft. Bowel sounds are normal. She exhibits no distension. There is no tenderness. There is no rebound. No hernia.         Healed incisions  Musculoskeletal: Normal range of motion. She exhibits no edema and no tenderness.  Neurological: She is alert and oriented to person, place, and time. She exhibits normal muscle tone.  Skin: Skin is warm and dry. No rash noted. She is not diaphoretic. No erythema.  Psychiatric: She has a normal mood and affect. Her behavior is normal. Judgment and thought content normal.    Data Reviewed None available  Assessment    Morbid obesity BMI  Hypertension Dislipidemia Osteoarthritis- joint pain Probable stress urinary incontinence    Plan    The patient meets weight loss surgery criteria based on her co-morbidities and BMI. I think the patient would be an acceptable candidate for Laparoscopic Roux-en-Y Gastric bypass or Laparoscopic adjustable gastric band or sleeve gastrectomy.    We discussed laparoscopic Roux-en-Y gastric bypass. We discussed the preoperative, operative and postoperative process. Using diagrams, I explained the surgery in detail including the performance of an EGD near the end of the surgery and an Upper GI swallow study on POD 1. We discussed the typical hospital course including a 2-3 day stay baring any complications.   The patient was given educational material. I quoted the patient that they can expect to lose 50-70% of their excess weight with the gastric bypass. We did discuss the possibility of  weight regain several years after the procedure.  We discussed the risk and benefits of surgery including but not limited to anesthesia risk, bleeding, infection, anastomotic edema requiring a few additional days in the hospital, postop nausea, blood clot formation, anastomotic leak, anastomotic stricture, ulcer formation, death, respiratory complications, intestinal blockage, internal hernia, gallstone formation, vitamin and nutritional deficiencies, injury to surrounding structures, failure to lose weight and mood changes.  We discussed that before and after surgery that there would be an alteration in their diet. I explained that we have put them on a diet 2 weeks before surgery. I also explained that they would be on a liquid diet for 2 weeks after surgery. We discussed that they would have to avoid certain foods such as sugar after surgery. We discussed the importance of physical activity  as well as compliance with our dietary and supplement recommendations and routine follow-up.  We then discussed laparoscopic adjustable gastric banding. The patient was given Agricultural engineer. We discussed the risk and benefits of surgery including but not limited to bleeding, infection, injury to surrounding structures, blood clot formation such as deep venous thrombosis or pulmonary embolism, need to convert to an open procedure, band slippage, band erosion, failure to loose weight, port complications (leak or flippage), potential need for reoperative surgery, esophageal dilatation, worsening reflux, and vitamin deficiencies. We discussed the typical post operative recovery course. We discussed that their postoperative diet will be modified for several weeks. We specifically talked about the need to be on a liquid diet for one to 2 weeks after surgery. We also discussed the typical postoperative course with a laparoscopic adjustable gastric band and the need for frequent postoperative visits to assess the volume  status of the band.  We discussed the typical expected weight loss with a laparoscopic adjustable gastric band. I explained to the patient that they can expect to lose 40-60% of their excess body weight if they are compliant with their postoperative instructions. However I did explain that some patients loose less than 40% and some patients lose more than 60% of their excess body weight.  I explained that the likelihood of improvement in their obesity is good.  The patient is currently leaning towards LRYGB.  They live a good part of the year in the [place] and I advised her that in order to be successful with LAGB she would close follow-up during the first year - being seen every 4-6 weeks to evaluate for potential adjustments.   I explained to the patient that we will start our evaluation process which includes labs, Upper GI to evaluate stomach and swallowing anatomy, nutritionist consultation, psychiatrist consultation, EKG, CXR, abdominal ultrasound  [name]. Andrey Campanile, MD, FACS General, Bariatric, & Minimally Invasive Surgery [facility], [place]          [facility] M 01/14/2012, 3:12 PM

## 2012-01-15 ENCOUNTER — Encounter: Payer: Self-pay | Admitting: *Deleted

## 2012-01-15 ENCOUNTER — Encounter: Payer: BC Managed Care – PPO | Attending: General Surgery | Admitting: *Deleted

## 2012-01-15 VITALS — Ht 62.5 in | Wt 246.5 lb

## 2012-01-15 DIAGNOSIS — Z01818 Encounter for other preprocedural examination: Secondary | ICD-10-CM | POA: Insufficient documentation

## 2012-01-15 DIAGNOSIS — Z713 Dietary counseling and surveillance: Secondary | ICD-10-CM | POA: Insufficient documentation

## 2012-01-15 DIAGNOSIS — E669 Obesity, unspecified: Secondary | ICD-10-CM | POA: Insufficient documentation

## 2012-01-15 NOTE — Patient Instructions (Signed)
   Follow Pre-Op Nutrition Goals to prepare for Gastric Bypass Surgery.   Call the [facility] at [phone] once you have been given your surgery date to enrolled in the Pre-Op Nutrition Class. You will need to attend this nutrition class 3-4 weeks prior to your surgery. 

## 2012-01-15 NOTE — Progress Notes (Signed)
  Pre-Op Assessment Visit:  Pre-Operative RYGB Surgery  Medical Nutrition Therapy:  Appt start time: 1500   End time:  1615.  Patient was seen on 01/15/2012 for Pre-Operative RYGB Nutrition Assessment. Assessment and letter of approval faxed to [facility] Bariatric Surgery Program coordinator on 01/16/12.  Approval letter sent to [facility] Scan center and will be available in the chart under the media tab.  TANITA  BODY COMP RESULTS  01/15/12   %Fat 52.3%   Fat Mass (lbs) 129.0   Fat Free Mass (lbs) 117.5   Total Body Water (lbs) 86.0   Handouts given during visit include:  Pre-Op Goals   Bariatric Surgery Protein Shakes handout  Bariatric Surgery 2 Week Pre-Op Diet  Bariatric Surgery RYGB Handbook  Patient to call for Pre-Op and Post-Op Nutrition Education at the [facility] when surgery is scheduled.

## 2012-01-21 ENCOUNTER — Telehealth (INDEPENDENT_AMBULATORY_CARE_PROVIDER_SITE_OTHER): Payer: Self-pay | Admitting: General Surgery

## 2012-01-21 NOTE — Telephone Encounter (Signed)
We received CBC, CMET and lipid panel results from patient's primary MD. Additional lab work that is needed will be ordered through [place] lab. Patient aware and will have this drawn.

## 2012-01-22 ENCOUNTER — Ambulatory Visit (HOSPITAL_COMMUNITY)
Admission: RE | Admit: 2012-01-22 | Discharge: 2012-01-22 | Disposition: A | Discharge status: Home / Self Care | Payer: BC Managed Care – PPO | Source: Ambulatory Visit | Attending: General Surgery | Admitting: General Surgery

## 2012-01-22 DIAGNOSIS — E66813 Obesity, class 3: Secondary | ICD-10-CM

## 2012-01-22 DIAGNOSIS — K219 Gastro-esophageal reflux disease without esophagitis: Secondary | ICD-10-CM | POA: Insufficient documentation

## 2012-01-22 DIAGNOSIS — J8410 Pulmonary fibrosis, unspecified: Secondary | ICD-10-CM | POA: Insufficient documentation

## 2012-01-22 DIAGNOSIS — I10 Essential (primary) hypertension: Secondary | ICD-10-CM | POA: Insufficient documentation

## 2012-01-22 DIAGNOSIS — Z6841 Body Mass Index (BMI) 40.0 and over, adult: Secondary | ICD-10-CM | POA: Insufficient documentation

## 2012-01-22 DIAGNOSIS — K449 Diaphragmatic hernia without obstruction or gangrene: Secondary | ICD-10-CM | POA: Insufficient documentation

## 2012-01-22 DIAGNOSIS — E785 Hyperlipidemia, unspecified: Secondary | ICD-10-CM | POA: Insufficient documentation

## 2012-01-22 DIAGNOSIS — M1990 Unspecified osteoarthritis, unspecified site: Secondary | ICD-10-CM | POA: Insufficient documentation

## 2012-02-13 ENCOUNTER — Telehealth (INDEPENDENT_AMBULATORY_CARE_PROVIDER_SITE_OTHER): Payer: Self-pay | Admitting: General Surgery

## 2012-02-13 NOTE — Telephone Encounter (Signed)
Got pt's chart that she had been approved for lapband; however, at office visit pt was leaning towards gastric bypass. Attempted to contact pt to determine which procedure she had decided on  - LM for her to call me

## 2012-02-17 ENCOUNTER — Other Ambulatory Visit (INDEPENDENT_AMBULATORY_CARE_PROVIDER_SITE_OTHER): Payer: Self-pay | Admitting: General Surgery

## 2012-02-19 ENCOUNTER — Telehealth (INDEPENDENT_AMBULATORY_CARE_PROVIDER_SITE_OTHER): Payer: Self-pay | Admitting: General Surgery

## 2012-02-19 NOTE — Telephone Encounter (Signed)
LMOM for patient to call back and ask for me, to inform her about stopping medications.

## 2012-02-19 NOTE — Telephone Encounter (Signed)
Patient needs to be made aware to stop aspirin 7 days prior to her gastric bypass surgery and her estrace 4 weeks prior.

## 2012-02-20 NOTE — Telephone Encounter (Signed)
Spoke with patient. Her surgery is set up for 03/09/12. She was made aware to stop aspirin 7 days prior and she is stopping her estrace now. She will call with any questions prior.

## 2012-02-26 ENCOUNTER — Encounter (HOSPITAL_COMMUNITY): Payer: Self-pay | Admitting: Pharmacy Technician

## 2012-02-26 ENCOUNTER — Encounter: Payer: BC Managed Care – PPO | Attending: General Surgery | Admitting: *Deleted

## 2012-02-26 VITALS — Ht 62.5 in | Wt 245.0 lb

## 2012-02-26 DIAGNOSIS — Z01818 Encounter for other preprocedural examination: Secondary | ICD-10-CM | POA: Insufficient documentation

## 2012-02-26 DIAGNOSIS — Z713 Dietary counseling and surveillance: Secondary | ICD-10-CM | POA: Insufficient documentation

## 2012-02-26 DIAGNOSIS — E669 Obesity, unspecified: Secondary | ICD-10-CM | POA: Insufficient documentation

## 2012-02-28 ENCOUNTER — Encounter: Payer: Self-pay | Admitting: *Deleted

## 2012-02-28 NOTE — Progress Notes (Signed)
Bariatric Class:  Appt start time: 0830 end time:  0930.  Pre-Operative Nutrition Class  Patient was seen on 02/26/12 for Pre-Operative Bariatric Surgery Education at [facility].   Surgery date: 03/09/12 Surgery type: RYGB [facility] weight at [facility]: 246.5 lb  Weight today: 245.0 lb Weight change: 1.5 lb Total weight lost: 1.5 lb BMI: 44.1  Samples given per MNT protocol: Celebrate Vitamins Multivitamin Lot # [id number] Exp: 09/14  Celebrate Vitamins Multivitamin-Complete Lot # [id number] Exp: 11/14  Opurity Calcium Citrate Lot # [id number] Exp: 11/14  Celebrate Vitamins Sublingual B12 Lot # [id number] Exp:05/15  Unjury Protein Powder Lot # [id number] Exp: 12/14  The following the learning objective met by the patient during this course:  Identifies Pre-Op Dietary Goals and will begin 2 weeks pre-operatively  Identifies appropriate sources of fluids and proteins   States protein recommendations and appropriate sources pre and post-operatively  Identifies Post-Operative Dietary Goals and will follow for 2 weeks post-operatively  Identifies appropriate multivitamin and calcium sources  Describes the need for physical activity post-operatively and will follow MD recommendations  States when to call healthcare provider regarding medication questions or post-operative complications  Handouts given during class include:  Pre-Op Bariatric Surgery Diet Handout  Protein Shake Handout  Post-Op Bariatric Surgery Nutrition Handout  BELT Program Information Flyer  Support Group Information Flyer  Follow-Up Plan: Patient will follow-up at [facility] 2 weeks post operatively for diet advancement per MD.

## 2012-02-28 NOTE — Patient Instructions (Addendum)
Follow:   Pre-Op Diet per MD 2 weeks prior to surgery  Phase 2- Liquids (clear/full) 2 weeks after surgery  Vitamin/Mineral/Calcium guidelines for purchasing bariatric supplements  Exercise guidelines pre and post-op per MD  Follow-up at [facility] in 2 weeks post-op for diet advancement. Contact Quandarius Nill as needed with questions/concerns. 

## 2012-03-01 NOTE — Progress Notes (Addendum)
EKG 12-05-11 Dr. Peter Swaziland Sinus Rhythm and CXR 01-22-12 in Epic normal

## 2012-03-01 NOTE — Progress Notes (Signed)
What does Egd stand for OR consent,please. Please message to my in basket box

## 2012-03-03 ENCOUNTER — Encounter (HOSPITAL_COMMUNITY): Payer: Self-pay

## 2012-03-03 ENCOUNTER — Encounter (HOSPITAL_COMMUNITY)
Admission: RE | Admit: 2012-03-03 | Discharge: 2012-03-03 | Disposition: A | Discharge status: Home / Self Care | Payer: BC Managed Care – PPO | Source: Ambulatory Visit | Attending: General Surgery | Admitting: General Surgery

## 2012-03-03 LAB — CBC WITH DIFFERENTIAL/PLATELET
Basophils Absolute: 0 10*3/uL (ref 0.0–0.1)
HCT: 39.4 % (ref 36.0–46.0)
Lymphocytes Relative: 35 % (ref 12–46)
Monocytes Absolute: 0.4 10*3/uL (ref 0.1–1.0)
Neutro Abs: 3.1 10*3/uL (ref 1.7–7.7)
RDW: 14.1 % (ref 11.5–15.5)
WBC: 5.8 10*3/uL (ref 4.0–10.5)

## 2012-03-03 LAB — COMPREHENSIVE METABOLIC PANEL
ALT: 19 U/L (ref 0–35)
AST: 17 U/L (ref 0–37)
Alkaline Phosphatase: 90 U/L (ref 39–117)
CO2: 27 mEq/L (ref 19–32)
Chloride: 97 mEq/L (ref 96–112)
Creatinine, Ser: 0.9 mg/dL (ref 0.50–1.10)
GFR calc non Af Amer: 68 mL/min — ABNORMAL LOW (ref >90)
Sodium: 135 mEq/L (ref 135–145)
Total Bilirubin: 0.2 mg/dL — ABNORMAL LOW (ref 0.3–1.2)

## 2012-03-03 NOTE — Progress Notes (Signed)
Please review CMET BUN abnormal

## 2012-03-03 NOTE — Progress Notes (Signed)
1013 for 0920 To see Dr. Andrey Campanile on Friday 03-05-2012 for pre op visit will ask about bowel prep.

## 2012-03-03 NOTE — Patient Instructions (Signed)
20 [place] S Show  03/03/2012   Your procedure is scheduled on:  Tuesday 03-09-2012  Report to [facility] at  AM. [phone]  Call this number if you have problems the morning of surgery: [phone]   Remember:   Do not drink liquids or  eat food:After Midnight. Monday night 03-08-2012      Take these medicines the morning of surgery with A SIP OF WATER: Take Wellbutrin   Do not wear jewelry, make-up or nail polish.  Do not wear lotions, powders, or perfumes. You may wear deodorant.  Do not shave 48 hours prior to surgery. Men may shave face and neck.  Do not bring valuables to the hospital.  Contacts, dentures or bridgework may not be worn into surgery.  Leave suitcase in the car. After surgery it may be brought to your room.  For patients admitted to the hospital, checkout time is 11:00 AM the day of discharge.   Patients discharged the day of surgery will not be allowed to drive home.  Name and phone number of your driver: Aveline Daus  [phone]  See [facility] Preparing for surgery sheet.   Please read over the following fact sheets that you were given: MRSA Information

## 2012-03-05 ENCOUNTER — Ambulatory Visit (INDEPENDENT_AMBULATORY_CARE_PROVIDER_SITE_OTHER): Payer: BC Managed Care – PPO | Admitting: General Surgery

## 2012-03-05 ENCOUNTER — Encounter (INDEPENDENT_AMBULATORY_CARE_PROVIDER_SITE_OTHER): Payer: Self-pay | Admitting: General Surgery

## 2012-03-05 VITALS — BP 108/76 | HR 64 | Temp 97.6°F | Resp 14 | Ht 62.5 in | Wt 241.4 lb

## 2012-03-05 MED ORDER — PEG-KCL-NACL-NASULF-NA ASC-C 100 G PO SOLR: 1.0000 | Freq: Once | ORAL | Status: DC

## 2012-03-05 NOTE — Patient Instructions (Signed)
Keep up with your pre-op diet Please pick up your bowel prep - Call if there is a problem with the prescription

## 2012-03-05 NOTE — Progress Notes (Signed)
Patient ID: [name] [name], female   DOB: 09-12-1950, 61 y.o.   MRN: [id number]  Chief Complaint  Patient presents with  . Pre-op Exam    gastric bypass    HPI [name] [name] is a 61 y.o. female.  HPI 61 yo WF comes in for her preop appointment for gastric bypass surgery scheduled for next week. Since her last appointment, she has done additional reading regarding gastric bypass. She has started her preop diet and already lost 4 pounds. She has stopped taking her estrace and aspirin.   PMHx, PSHx, SOCHx, FAMHx, ALL reviewed   Past Medical History  Diagnosis Date  . Hypertension   . Hyperlipidemia   . Obesity   . Chronic anxiety   . Cancer     melanoma  . Arthritis     feet,knees,hands    Past Surgical History  Procedure Date  . Low back 2010    x3  . Appendectomy 1978  . Abdominal hysterectomy   . Right ureter surgery     in college    Family History  Problem Relation Age of Onset  . Lung cancer Mother   . Cancer Mother     lung  . Heart disease Father   . HIV Brother     Social History History  Substance Use Topics  . Smoking status: Former Smoker    Quit date: 05/12/1976  . Smokeless tobacco: Never Used  . Alcohol Use: Yes     1 glass wine/week    No Known Allergies  Current Outpatient Prescriptions  Medication Sig Dispense Refill  . atorvastatin (LIPITOR) 20 MG tablet Take 20 mg by mouth at bedtime.       . calcium gluconate 500 MG tablet Take 500 mg by mouth 2 (two) times daily.      Marland Kitchen estradiol (ESTRACE) 0.5 MG tablet Take 0.5 mg by mouth 2 (two) times daily.      Marland Kitchen FLUoxetine (PROZAC) 20 MG/5ML solution Take by mouth daily.      . hydrochlorothiazide (HYDRODIURIL) 25 MG tablet Take 25 mg by mouth daily.      . propranolol (INDERAL) 20 MG/5ML solution Take by mouth 3 (three) times daily.      Marland Kitchen aspirin 81 MG tablet Take 81 mg by mouth at bedtime.       . diclofenac (VOLTAREN) 75 MG EC tablet Take 150 mg by mouth every morning.       .  Eszopiclone (ESZOPICLONE) 3 MG TABS Take 3 mg by mouth at bedtime. Take immediately before bedtime        Review of Systems Review of Systems  Constitutional: Negative for fever, activity change, appetite change, fatigue and unexpected weight change.  HENT: Negative for hearing loss, nosebleeds and neck pain.   Eyes: Negative for photophobia.  Respiratory: Negative for chest tightness and shortness of breath.   Cardiovascular: Negative for chest pain, palpitations and leg swelling.  Gastrointestinal: Negative for abdominal pain, diarrhea and constipation.       Denies reflux  Genitourinary: Negative for dysuria and difficulty urinating.  Musculoskeletal: Positive for back pain. Negative for gait problem.       Hip pain  Neurological: Positive for headaches (some HA since stopping estrace). Negative for tremors, seizures, speech difficulty, weakness and light-headedness.  Hematological: Negative for adenopathy. Does not bruise/bleed easily.  Psychiatric/Behavioral: Negative for behavioral problems and confusion.  All other systems reviewed and are negative.    Blood pressure 108/76, pulse 64, temperature 97.6  F (36.4 C), temperature source Temporal, resp. rate 14, height 5' 2.5" (1.588 m), weight 241 lb 6 oz (109.487 kg).  Physical Exam Physical Exam  Vitals reviewed. Constitutional: She is oriented to person, place, and time. She appears well-developed and well-nourished. No distress.  HENT:  Head: Normocephalic and atraumatic.  Right Ear: External ear normal.  Left Ear: External ear normal.  Eyes: Conjunctivae normal are normal. No scleral icterus.  Neck: Normal range of motion. Neck supple. No tracheal deviation present. No thyromegaly present.  Cardiovascular: Normal rate, regular rhythm, normal heart sounds and intact distal pulses.   Pulmonary/Chest: Effort normal and breath sounds normal. No respiratory distress. She has no wheezes.  Abdominal: Soft. Bowel sounds are  normal. She exhibits no distension. There is no tenderness. There is no rebound and no guarding.    Musculoskeletal: Normal range of motion. She exhibits no edema and no tenderness.  Lymphadenopathy:    She has no cervical adenopathy.  Neurological: She is alert and oriented to person, place, and time. She exhibits normal muscle tone.  Skin: Skin is warm and dry. No rash noted. She is not diaphoretic. No erythema.  Psychiatric: She has a normal mood and affect. Her behavior is normal. Judgment and thought content normal.    Data Reviewed My office note from 01/2012 Dr Elvis Coil office note Preop labs - wnl CXR - stable right lung granuloma Abd u/s - ok  UPPER GI SERIES WITH KUB  Technique: Routine upper GI series was performed with thin barium  Fluoroscopy Time: 2.0 minutes  Comparison: None.  Findings: KUB: A nonobstructive bowel gas pattern is noted with a  moderate amount of stool present throughout the colon. No abnormal  calcifications are seen. 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  Upper GI: The patient was able to swallow barium without  difficulty. Esophageal mucosa and motility appeared within normal  limits. A small sliding variety hiatal hernia was evident.  Gastroesophageal reflux to the high cervical esophagus was  identified. This cleared rapidly but was associated with some  presbyesophagus.  Gastric mucosa and motility appear within normal limits. The  duodenal bulb appeared normal and the visualized portion of the  proximal small bowel has a normal appearance.  Overhead views performed at the conclusion of the exam revealed a  dense nodule in the posterior sulcus of the right lower lobe which  correlates with a calcified granuloma in this location on prior CT  from 2008  IMPRESSION:  Small sliding variety hiatal hernia with  associated high  gastroesophageal reflux which showed some associated  presbyesophagus during clearing.  Otherwise unremarkable.    Assessment    Morbid obesity BMI 43.4 Hypertension Hypercholesterolemia OA Questionable stress urinary incontinence    Plan    I congratulated the patient on her weight loss. I encouraged her to continue with her preoperative diet. She was given a electronic prescription for her preoperative bowel prep. She is currently scheduled for laparoscopic Roux-en-Y gastric bypass next Tuesday. All of her questions were asked and answered. We reviewed her preoperative imaging as well as preoperative labs.  [name]. Andrey Campanile, MD, FACS General, Bariatric, & Minimally Invasive Surgery [facility], [place]        [facility] M 03/05/2012, 11:18 AM

## 2012-03-09 ENCOUNTER — Ambulatory Visit (HOSPITAL_COMMUNITY): Payer: BC Managed Care – PPO | Admitting: Anesthesiology

## 2012-03-09 ENCOUNTER — Encounter (HOSPITAL_COMMUNITY): Payer: Self-pay | Admitting: *Deleted

## 2012-03-09 ENCOUNTER — Encounter (HOSPITAL_COMMUNITY)
Admission: RE | Disposition: A | Discharge status: Home / Self Care | Payer: Self-pay | Source: Ambulatory Visit | Attending: General Surgery

## 2012-03-09 ENCOUNTER — Inpatient Hospital Stay (HOSPITAL_COMMUNITY)
Admission: RE | Admit: 2012-03-09 | Discharge: 2012-03-11 | DRG: 288 | Disposition: A | Discharge status: Home / Self Care | Payer: BC Managed Care – PPO | Source: Ambulatory Visit | Attending: General Surgery | Admitting: General Surgery

## 2012-03-09 ENCOUNTER — Encounter (HOSPITAL_COMMUNITY): Payer: Self-pay | Admitting: Anesthesiology

## 2012-03-09 DIAGNOSIS — Z01812 Encounter for preprocedural laboratory examination: Secondary | ICD-10-CM

## 2012-03-09 DIAGNOSIS — Z85820 Personal history of malignant melanoma of skin: Secondary | ICD-10-CM

## 2012-03-09 DIAGNOSIS — M159 Polyosteoarthritis, unspecified: Secondary | ICD-10-CM

## 2012-03-09 DIAGNOSIS — F411 Generalized anxiety disorder: Secondary | ICD-10-CM | POA: Diagnosis present

## 2012-03-09 DIAGNOSIS — Z6841 Body Mass Index (BMI) 40.0 and over, adult: Secondary | ICD-10-CM

## 2012-03-09 DIAGNOSIS — R031 Nonspecific low blood-pressure reading: Secondary | ICD-10-CM | POA: Diagnosis present

## 2012-03-09 DIAGNOSIS — I10 Essential (primary) hypertension: Secondary | ICD-10-CM

## 2012-03-09 DIAGNOSIS — E669 Obesity, unspecified: Secondary | ICD-10-CM | POA: Diagnosis present

## 2012-03-09 DIAGNOSIS — E785 Hyperlipidemia, unspecified: Secondary | ICD-10-CM | POA: Diagnosis present

## 2012-03-09 HISTORY — PX: UPPER GI ENDOSCOPY: SHX6162

## 2012-03-09 HISTORY — PX: GASTRIC ROUX-EN-Y: SHX5262

## 2012-03-09 LAB — HEMOGLOBIN AND HEMATOCRIT, BLOOD
HCT: 36.1 % (ref 36.0–46.0)
Hemoglobin: 11.9 g/dL — ABNORMAL LOW (ref 12.0–15.0)

## 2012-03-09 SURGERY — LAPAROSCOPIC ROUX-EN-Y GASTRIC
Anesthesia: General | Site: Abdomen | Wound class: Clean Contaminated

## 2012-03-09 MED ORDER — MIDAZOLAM HCL 5 MG/5ML IJ SOLN
INTRAMUSCULAR | Status: DC | PRN
  Administered 2012-03-09 (×2): 1 mg via INTRAVENOUS

## 2012-03-09 MED ORDER — ONDANSETRON HCL 4 MG/2ML IJ SOLN
4.0000 mg | INTRAMUSCULAR | Status: DC | PRN
  Administered 2012-03-09: 4 mg via INTRAVENOUS
  Filled 2012-03-09: qty 2

## 2012-03-09 MED ORDER — LACTATED RINGERS IV SOLN
INTRAVENOUS | Status: DC
  Administered 2012-03-09 (×3): via INTRAVENOUS
  Administered 2012-03-09: 1000 mL via INTRAVENOUS

## 2012-03-09 MED ORDER — FENTANYL CITRATE 0.05 MG/ML IJ SOLN
INTRAMUSCULAR | Status: DC | PRN
  Administered 2012-03-09 (×2): 50 ug via INTRAVENOUS
  Administered 2012-03-09: 100 ug via INTRAVENOUS
  Administered 2012-03-09: 50 ug via INTRAVENOUS

## 2012-03-09 MED ORDER — LACTATED RINGERS IV SOLN: INTRAVENOUS | Status: DC

## 2012-03-09 MED ORDER — EPHEDRINE SULFATE 50 MG/ML IJ SOLN
INTRAMUSCULAR | Status: DC | PRN
  Administered 2012-03-09: 5 mg via INTRAVENOUS
  Administered 2012-03-09: 10 mg via INTRAVENOUS

## 2012-03-09 MED ORDER — BISOPROLOL FUMARATE 10 MG PO TABS
10.0000 mg | ORAL_TABLET | Freq: Once | ORAL | Status: AC
  Administered 2012-03-09: 10 mg via ORAL
  Filled 2012-03-09 (×2): qty 1

## 2012-03-09 MED ORDER — PANTOPRAZOLE SODIUM 40 MG IV SOLR
40.0000 mg | Freq: Every day | INTRAVENOUS | Status: DC
  Administered 2012-03-09 – 2012-03-11 (×3): 40 mg via INTRAVENOUS
  Filled 2012-03-09 (×3): qty 40

## 2012-03-09 MED ORDER — DEXAMETHASONE SODIUM PHOSPHATE 10 MG/ML IJ SOLN
INTRAMUSCULAR | Status: DC | PRN
  Administered 2012-03-09: 10 mg via INTRAVENOUS

## 2012-03-09 MED ORDER — DEXTROSE 5 % IV SOLN
2.0000 g | INTRAVENOUS | Status: AC
  Administered 2012-03-09: 2 g via INTRAVENOUS
  Filled 2012-03-09: qty 2

## 2012-03-09 MED ORDER — OXYCODONE-ACETAMINOPHEN 5-325 MG/5ML PO SOLN
5.0000 mL | ORAL | Status: DC | PRN
  Administered 2012-03-10 – 2012-03-11 (×5): 5 mL via ORAL
  Filled 2012-03-09 (×2): qty 5
  Filled 2012-03-09: qty 10
  Filled 2012-03-09: qty 5

## 2012-03-09 MED ORDER — UNJURY CHOCOLATE CLASSIC POWDER
2.0000 [oz_av] | Freq: Four times a day (QID) | ORAL | Status: DC
  Administered 2012-03-11: 2 [oz_av] via ORAL

## 2012-03-09 MED ORDER — ROCURONIUM BROMIDE 100 MG/10ML IV SOLN
INTRAVENOUS | Status: DC | PRN
  Administered 2012-03-09: 35 mg via INTRAVENOUS
  Administered 2012-03-09: 10 mg via INTRAVENOUS
  Administered 2012-03-09 (×2): 5 mg via INTRAVENOUS
  Administered 2012-03-09 (×2): 10 mg via INTRAVENOUS
  Administered 2012-03-09: 20 mg via INTRAVENOUS
  Administered 2012-03-09: 5 mg via INTRAVENOUS

## 2012-03-09 MED ORDER — ENOXAPARIN SODIUM 40 MG/0.4ML ~~LOC~~ SOLN
40.0000 mg | SUBCUTANEOUS | Status: DC
  Administered 2012-03-10 – 2012-03-11 (×2): 40 mg via SUBCUTANEOUS
  Filled 2012-03-09 (×3): qty 0.4

## 2012-03-09 MED ORDER — LACTATED RINGERS IR SOLN
Status: DC | PRN
  Administered 2012-03-09: 1000 mL
  Administered 2012-03-09: 1

## 2012-03-09 MED ORDER — TISSEEL VH 10 ML EX KIT
PACK | CUTANEOUS | Status: DC | PRN
  Administered 2012-03-09 (×2): 10 mL

## 2012-03-09 MED ORDER — ACETAMINOPHEN 160 MG/5ML PO SOLN
650.0000 mg | ORAL | Status: DC | PRN
  Administered 2012-03-10 (×2): 650 mg via ORAL
  Filled 2012-03-09 (×2): qty 20.3

## 2012-03-09 MED ORDER — SUCCINYLCHOLINE CHLORIDE 20 MG/ML IJ SOLN
INTRAMUSCULAR | Status: DC | PRN
  Administered 2012-03-09: 100 mg via INTRAVENOUS

## 2012-03-09 MED ORDER — HYDROMORPHONE HCL PF 1 MG/ML IJ SOLN
0.2500 mg | INTRAMUSCULAR | Status: DC | PRN
  Administered 2012-03-09: 0.5 mg via INTRAVENOUS
  Administered 2012-03-09 (×4): 0.25 mg via INTRAVENOUS
  Administered 2012-03-09: 0.5 mg via INTRAVENOUS

## 2012-03-09 MED ORDER — MORPHINE SULFATE 2 MG/ML IJ SOLN
2.0000 mg | INTRAMUSCULAR | Status: DC | PRN
  Administered 2012-03-09 (×2): 4 mg via INTRAVENOUS
  Administered 2012-03-09: 2 mg via INTRAVENOUS
  Administered 2012-03-09 (×2): 4 mg via INTRAVENOUS
  Administered 2012-03-10 (×2): 2 mg via INTRAVENOUS
  Administered 2012-03-10: 4 mg via INTRAVENOUS
  Administered 2012-03-10: 2 mg via INTRAVENOUS
  Filled 2012-03-09: qty 2
  Filled 2012-03-09 (×2): qty 1
  Filled 2012-03-09 (×3): qty 2
  Filled 2012-03-09: qty 1
  Filled 2012-03-09: qty 2
  Filled 2012-03-09: qty 1
  Filled 2012-03-09: qty 2

## 2012-03-09 MED ORDER — BUPIVACAINE-EPINEPHRINE 0.25% -1:200000 IJ SOLN
INTRAMUSCULAR | Status: DC | PRN
  Administered 2012-03-09: 60 mL

## 2012-03-09 MED ORDER — LIDOCAINE HCL (CARDIAC) 20 MG/ML IV SOLN
INTRAVENOUS | Status: DC | PRN
  Administered 2012-03-09: 80 mg via INTRAVENOUS

## 2012-03-09 MED ORDER — ACETAMINOPHEN 10 MG/ML IV SOLN
INTRAVENOUS | Status: DC | PRN
  Administered 2012-03-09: 1000 mg via INTRAVENOUS

## 2012-03-09 MED ORDER — HEPARIN SODIUM (PORCINE) 5000 UNIT/ML IJ SOLN
5000.0000 [IU] | INTRAMUSCULAR | Status: AC
  Administered 2012-03-09: 5000 [IU] via SUBCUTANEOUS
  Filled 2012-03-09: qty 1

## 2012-03-09 MED ORDER — ONDANSETRON HCL 4 MG/2ML IJ SOLN
INTRAMUSCULAR | Status: DC | PRN
  Administered 2012-03-09: 4 mg via INTRAVENOUS

## 2012-03-09 MED ORDER — UNJURY CHICKEN SOUP POWDER: 2.0000 [oz_av] | Freq: Four times a day (QID) | ORAL | Status: DC

## 2012-03-09 MED ORDER — UNJURY VANILLA POWDER: 2.0000 [oz_av] | Freq: Four times a day (QID) | ORAL | Status: DC

## 2012-03-09 MED ORDER — ACETAMINOPHEN 10 MG/ML IV SOLN
1000.0000 mg | Freq: Four times a day (QID) | INTRAVENOUS | Status: AC
  Administered 2012-03-09 – 2012-03-10 (×4): 1000 mg via INTRAVENOUS
  Filled 2012-03-09 (×5): qty 100

## 2012-03-09 MED ORDER — KCL IN DEXTROSE-NACL 20-5-0.45 MEQ/L-%-% IV SOLN
INTRAVENOUS | Status: DC
  Administered 2012-03-09 – 2012-03-11 (×5): via INTRAVENOUS
  Filled 2012-03-09 (×8): qty 1000

## 2012-03-09 MED ORDER — PROPOFOL 10 MG/ML IV BOLUS
INTRAVENOUS | Status: DC | PRN
  Administered 2012-03-09: 180 mg via INTRAVENOUS

## 2012-03-09 MED ORDER — PROMETHAZINE HCL 25 MG/ML IJ SOLN: 6.2500 mg | INTRAMUSCULAR | Status: DC | PRN

## 2012-03-09 SURGICAL SUPPLY — 79 items
APPLICATOR COTTON TIP 6IN STRL (MISCELLANEOUS) IMPLANT
APPLIER CLIP ROT 13.4 12 LRG (CLIP)
BENZOIN TINCTURE PRP APPL 2/3 (GAUZE/BANDAGES/DRESSINGS) IMPLANT
BLADE SURG 15 STRL LF DISP TIS (BLADE) ×1 IMPLANT
BLADE SURG 15 STRL SS (BLADE) ×1
BLADE SURG SZ11 CARB STEEL (BLADE) ×2 IMPLANT
CABLE HIGH FREQUENCY MONO STRZ (ELECTRODE) ×2 IMPLANT
CANISTER SUCTION 2500CC (MISCELLANEOUS) ×4 IMPLANT
CLIP APPLIE ROT 13.4 12 LRG (CLIP) IMPLANT
CLIP SUT LAPRA TY ABSORB (SUTURE) ×4 IMPLANT
CLOTH BEACON ORANGE TIMEOUT ST (SAFETY) ×2 IMPLANT
COVER SURGICAL LIGHT HANDLE (MISCELLANEOUS) ×2 IMPLANT
CUTTER LINEAR ENDO ART 45 ETS (STAPLE) ×2 IMPLANT
DERMABOND ADVANCED (GAUZE/BANDAGES/DRESSINGS) ×1
DERMABOND ADVANCED .7 DNX12 (GAUZE/BANDAGES/DRESSINGS) ×1 IMPLANT
DEVICE SUTURE ENDOST 10MM (ENDOMECHANICALS) ×2 IMPLANT
DISSECTOR BLUNT TIP ENDO 5MM (MISCELLANEOUS) IMPLANT
DRAIN PENROSE 18X1/4 LTX STRL (WOUND CARE) ×2 IMPLANT
DRAPE CAMERA CLOSED 9X96 (DRAPES) ×2 IMPLANT
DRAPE UTILITY XL STRL (DRAPES) ×2 IMPLANT
ELECT REM PT RETURN 9FT ADLT (ELECTROSURGICAL) ×2
ELECTRODE REM PT RTRN 9FT ADLT (ELECTROSURGICAL) ×1 IMPLANT
GAUZE SPONGE 4X4 16PLY XRAY LF (GAUZE/BANDAGES/DRESSINGS) ×2 IMPLANT
GLOVE BIO SURGEON STRL SZ7.5 (GLOVE) ×2 IMPLANT
GLOVE BIOGEL M STRL SZ7.5 (GLOVE) IMPLANT
GLOVE BIOGEL PI IND STRL 7.0 (GLOVE) ×1 IMPLANT
GLOVE BIOGEL PI INDICATOR 7.0 (GLOVE) ×1
GLOVE INDICATOR 8.0 STRL GRN (GLOVE) ×2 IMPLANT
GOWN STRL NON-REIN LRG LVL3 (GOWN DISPOSABLE) ×2 IMPLANT
GOWN STRL REIN XL XLG (GOWN DISPOSABLE) ×14 IMPLANT
HEMOSTAT SURGICEL 4X8 (HEMOSTASIS) IMPLANT
HOVERMATT SINGLE USE (MISCELLANEOUS) ×2 IMPLANT
KIT BASIN OR (CUSTOM PROCEDURE TRAY) ×2 IMPLANT
KIT GASTRIC LAVAGE 34FR ADT (SET/KITS/TRAYS/PACK) ×2 IMPLANT
NEEDLE SPNL 22GX3.5 QUINCKE BK (NEEDLE) ×2 IMPLANT
NS IRRIG 1000ML POUR BTL (IV SOLUTION) ×2 IMPLANT
PACK CARDIOVASCULAR III (CUSTOM PROCEDURE TRAY) ×2 IMPLANT
PEN SKIN MARKING BROAD (MISCELLANEOUS) ×2 IMPLANT
POUCH SPECIMEN RETRIEVAL 10MM (ENDOMECHANICALS) ×2 IMPLANT
RELOAD 45 VASCULAR/THIN (ENDOMECHANICALS) ×2 IMPLANT
RELOAD BLUE (STAPLE) ×4 IMPLANT
RELOAD ENDO STITCH 2.0 (ENDOMECHANICALS)
RELOAD GOLD (STAPLE) ×2 IMPLANT
RELOAD STAPLE TA45 3.5 REG BLU (ENDOMECHANICALS) ×2 IMPLANT
RELOAD WHITE ECR60W (STAPLE) ×2 IMPLANT
SCALPEL HARMONIC ACE (MISCELLANEOUS) ×2 IMPLANT
SCISSORS LAP 5X35 DISP (ENDOMECHANICALS) ×2 IMPLANT
SEALANT SURGICAL APPL DUAL CAN (MISCELLANEOUS) ×4 IMPLANT
SET IRRIG TUBING LAPAROSCOPIC (IRRIGATION / IRRIGATOR) ×2 IMPLANT
SLEEVE ADV FIXATION 12X100MM (TROCAR) ×4 IMPLANT
SLEEVE ADV FIXATION 5X100MM (TROCAR) ×2 IMPLANT
SLEEVE ENDOPATH XCEL 5M (ENDOMECHANICALS) ×2 IMPLANT
SOLUTION ANTI FOG 6CC (MISCELLANEOUS) ×2 IMPLANT
SPONGE GAUZE 4X4 12PLY (GAUZE/BANDAGES/DRESSINGS) ×2 IMPLANT
STAPLE ECHEON FLEX 60 POW ENDO (STAPLE) ×2 IMPLANT
STAPLER VISISTAT 35W (STAPLE) ×2 IMPLANT
STRIP CLOSURE SKIN 1/2X4 (GAUZE/BANDAGES/DRESSINGS) IMPLANT
SUT ETHILON 3 0 PS 1 (SUTURE) ×6 IMPLANT
SUT MNCRL AB 4-0 PS2 18 (SUTURE) ×2 IMPLANT
SUT RELOAD ENDO STITCH 2 48X1 (ENDOMECHANICALS)
SUT RELOAD ENDO STITCH 2.0 (ENDOMECHANICALS)
SUT SILK 2 0 SH (SUTURE) IMPLANT
SUT VIC AB 2-0 SH 27 (SUTURE) ×1
SUT VIC AB 2-0 SH 27X BRD (SUTURE) ×1 IMPLANT
SUTURE RELOAD END STTCH 2 48X1 (ENDOMECHANICALS) IMPLANT
SUTURE RELOAD ENDO STITCH 2.0 (ENDOMECHANICALS) IMPLANT
SYR 20CC LL (SYRINGE) ×2 IMPLANT
SYR 50ML LL SCALE MARK (SYRINGE) ×2 IMPLANT
SYR CONTROL 10ML LL (SYRINGE) ×2 IMPLANT
TAPE CLOTH SURG 4X10 WHT LF (GAUZE/BANDAGES/DRESSINGS) ×2 IMPLANT
TOWEL OR 17X26 10 PK STRL BLUE (TOWEL DISPOSABLE) ×2 IMPLANT
TRAY FOLEY CATH 14FRSI W/METER (CATHETERS) ×2 IMPLANT
TROCAR BALLN 12MMX100 BLUNT (TROCAR) ×2 IMPLANT
TROCAR BLADELESS OPT 5 100 (ENDOMECHANICALS) ×4 IMPLANT
TROCAR ENDOPATH XCEL 12X100 BL (ENDOMECHANICALS) ×6 IMPLANT
TROCAR XCEL 12X100 BLDLESS (ENDOMECHANICALS) ×2 IMPLANT
TUBING ENDO SMARTCAP (MISCELLANEOUS) ×2 IMPLANT
TUBING FILTER THERMOFLATOR (ELECTROSURGICAL) ×2 IMPLANT
WATER STERILE IRR 1500ML POUR (IV SOLUTION) ×2 IMPLANT

## 2012-03-09 NOTE — Interval H&P Note (Signed)
History and Physical Interval Note:  03/09/2012 8:19 AM  [place] S Dhillon  has presented today for surgery, with the diagnosis of MORBID OBESITY  The various methods of treatment have been discussed with the patient and family. After consideration of risks, benefits and other options for treatment, the patient has consented to  Procedure(s) (LRB) with comments: LAPAROSCOPIC ROUX-EN-Y GASTRIC (N/A) as a surgical intervention .  The patient's history has been reviewed, patient examined, no change in status, stable for surgery.  I have reviewed the patient's chart and labs.  Questions were answered to the patient's satisfaction.    [name]. Andrey Campanile, MD, FACS General, Bariatric, & Minimally Invasive Surgery [facility], [place]    [facility] M

## 2012-03-09 NOTE — Anesthesia Postprocedure Evaluation (Signed)
Anesthesia Post Note  Patient: [name] [name]  Procedure(s) Performed: Procedure(s) (LRB): LAPAROSCOPIC ROUX-EN-Y GASTRIC (N/A) UPPER GI ENDOSCOPY (N/A)  Anesthesia type: General  Patient location: PACU  Post pain: Pain level controlled  Post assessment: Post-op Vital signs reviewed  Last Vitals:  Filed Vitals:   03/09/12 1245  BP: 130/61  Pulse: 63  Temp:   Resp: 17    Post vital signs: Reviewed  Level of consciousness: sedated  Complications: No apparent anesthesia complications

## 2012-03-09 NOTE — Anesthesia Preprocedure Evaluation (Signed)
Anesthesia Evaluation  Patient identified by MRN, date of birth, ID band Patient awake    Reviewed: Allergy & Precautions, H&P , NPO status , Patient's Chart, lab work & pertinent test results  Airway Mallampati: II TM Distance: >3 FB Neck ROM: Full    Dental  (+) Teeth Intact and Dental Advisory Given   Pulmonary neg pulmonary ROS,  breath sounds clear to auscultation  Pulmonary exam normal       Cardiovascular hypertension, Pt. on medications and Pt. on home beta blockers negative cardio ROS  Rhythm:Regular Rate:Normal     Neuro/Psych Anxiety negative neurological ROS  negative psych ROS   GI/Hepatic negative GI ROS, Neg liver ROS,   Endo/Other  negative endocrine ROS  Renal/GU negative Renal ROS  negative genitourinary   Musculoskeletal negative musculoskeletal ROS (+)   Abdominal   Peds  Hematology negative hematology ROS (+)   Anesthesia Other Findings   Reproductive/Obstetrics negative OB ROS                           Anesthesia Physical Anesthesia Plan  ASA: III  Anesthesia Plan: General   Post-op Pain Management:    Induction: Intravenous  Airway Management Planned: Oral ETT  Additional Equipment:   Intra-op Plan:   Post-operative Plan: Extubation in OR  Informed Consent: I have reviewed the patients History and Physical, chart, labs and discussed the procedure including the risks, benefits and alternatives for the proposed anesthesia with the patient or authorized representative who has indicated his/her understanding and acceptance.   Dental advisory given  Plan Discussed with: CRNA  Anesthesia Plan Comments:         Anesthesia Quick Evaluation

## 2012-03-09 NOTE — H&P (View-Only) (Signed)
Patient ID: [name] [name], female   DOB: 10/19/1950, 61 y.o.   MRN: [id number]  Chief Complaint  Patient presents with  . Pre-op Exam    gastric bypass    HPI [name] [name] is a 61 y.o. female.  HPI 61 yo WF comes in for her preop appointment for gastric bypass surgery scheduled for next week. Since her last appointment, she has done additional reading regarding gastric bypass. She has started her preop diet and already lost 4 pounds. She has stopped taking her estrace and aspirin.   PMHx, PSHx, SOCHx, FAMHx, ALL reviewed   Past Medical History  Diagnosis Date  . Hypertension   . Hyperlipidemia   . Obesity   . Chronic anxiety   . Cancer     melanoma  . Arthritis     feet,knees,hands    Past Surgical History  Procedure Date  . Low back 2010    x3  . Appendectomy 1978  . Abdominal hysterectomy   . Right ureter surgery     in college    Family History  Problem Relation Age of Onset  . Lung cancer Mother   . Cancer Mother     lung  . Heart disease Father   . HIV Brother     Social History History  Substance Use Topics  . Smoking status: Former Smoker    Quit date: 05/12/1976  . Smokeless tobacco: Never Used  . Alcohol Use: Yes     1 glass wine/week    No Known Allergies  Current Outpatient Prescriptions  Medication Sig Dispense Refill  . atorvastatin (LIPITOR) 20 MG tablet Take 20 mg by mouth at bedtime.       . calcium gluconate 500 MG tablet Take 500 mg by mouth 2 (two) times daily.      . estradiol (ESTRACE) 0.5 MG tablet Take 0.5 mg by mouth 2 (two) times daily.      . FLUoxetine (PROZAC) 20 MG/5ML solution Take by mouth daily.      . hydrochlorothiazide (HYDRODIURIL) 25 MG tablet Take 25 mg by mouth daily.      . propranolol (INDERAL) 20 MG/5ML solution Take by mouth 3 (three) times daily.      . aspirin 81 MG tablet Take 81 mg by mouth at bedtime.       . diclofenac (VOLTAREN) 75 MG EC tablet Take 150 mg by mouth every morning.       .  Eszopiclone (ESZOPICLONE) 3 MG TABS Take 3 mg by mouth at bedtime. Take immediately before bedtime        Review of Systems Review of Systems  Constitutional: Negative for fever, activity change, appetite change, fatigue and unexpected weight change.  HENT: Negative for hearing loss, nosebleeds and neck pain.   Eyes: Negative for photophobia.  Respiratory: Negative for chest tightness and shortness of breath.   Cardiovascular: Negative for chest pain, palpitations and leg swelling.  Gastrointestinal: Negative for abdominal pain, diarrhea and constipation.       Denies reflux  Genitourinary: Negative for dysuria and difficulty urinating.  Musculoskeletal: Positive for back pain. Negative for gait problem.       Hip pain  Neurological: Positive for headaches (some HA since stopping estrace). Negative for tremors, seizures, speech difficulty, weakness and light-headedness.  Hematological: Negative for adenopathy. Does not bruise/bleed easily.  Psychiatric/Behavioral: Negative for behavioral problems and confusion.  All other systems reviewed and are negative.    Blood pressure 108/76, pulse 64, temperature 97.6   F (36.4 C), temperature source Temporal, resp. rate 14, height 5' 2.5" (1.588 m), weight 241 lb 6 oz (109.487 kg).  Physical Exam Physical Exam  Vitals reviewed. Constitutional: She is oriented to person, place, and time. She appears well-developed and well-nourished. No distress.  HENT:  Head: Normocephalic and atraumatic.  Right Ear: External ear normal.  Left Ear: External ear normal.  Eyes: Conjunctivae normal are normal. No scleral icterus.  Neck: Normal range of motion. Neck supple. No tracheal deviation present. No thyromegaly present.  Cardiovascular: Normal rate, regular rhythm, normal heart sounds and intact distal pulses.   Pulmonary/Chest: Effort normal and breath sounds normal. No respiratory distress. She has no wheezes.  Abdominal: Soft. Bowel sounds are  normal. She exhibits no distension. There is no tenderness. There is no rebound and no guarding.    Musculoskeletal: Normal range of motion. She exhibits no edema and no tenderness.  Lymphadenopathy:    She has no cervical adenopathy.  Neurological: She is alert and oriented to person, place, and time. She exhibits normal muscle tone.  Skin: Skin is warm and dry. No rash noted. She is not diaphoretic. No erythema.  Psychiatric: She has a normal mood and affect. Her behavior is normal. Judgment and thought content normal.    Data Reviewed My office note from 01/2012 Dr Jordan's office note Preop labs - wnl CXR - stable right lung granuloma Abd u/s - ok  UPPER GI SERIES WITH KUB  Technique: Routine upper GI series was performed with thin barium  Fluoroscopy Time: 2.0 minutes  Comparison: None.  Findings: KUB: A nonobstructive bowel gas pattern is noted with a  moderate amount of stool present throughout the colon. No abnormal  calcifications are seen. 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  Upper GI: The patient was able to swallow barium without  difficulty. Esophageal mucosa and motility appeared within normal  limits. A small sliding variety hiatal hernia was evident.  Gastroesophageal reflux to the high cervical esophagus was  identified. This cleared rapidly but was associated with some  presbyesophagus.  Gastric mucosa and motility appear within normal limits. The  duodenal bulb appeared normal and the visualized portion of the  proximal small bowel has a normal appearance.  Overhead views performed at the conclusion of the exam revealed a  dense nodule in the posterior sulcus of the right lower lobe which  correlates with a calcified granuloma in this location on prior CT  from 2008  IMPRESSION:  Small sliding variety hiatal hernia with  associated high  gastroesophageal reflux which showed some associated  presbyesophagus during clearing.  Otherwise unremarkable.    Assessment    Morbid obesity BMI 43.4 Hypertension Hypercholesterolemia OA Questionable stress urinary incontinence    Plan    I congratulated the patient on her weight loss. I encouraged her to continue with her preoperative diet. She was given a electronic prescription for her preoperative bowel prep. She is currently scheduled for laparoscopic Roux-en-Y gastric bypass next Tuesday. All of her questions were asked and answered. We reviewed her preoperative imaging as well as preoperative labs.  Rashun Grattan M. Faven Watterson, MD, FACS General, Bariatric, & Minimally Invasive Surgery [facility]        Clete Kuch M 03/05/2012, 11:18 AM    

## 2012-03-09 NOTE — Op Note (Signed)
Preop diagnosis:  1. Morbid obesity (BMI 43)  2. Hypertension 3. OA 4. Hyperlipidemia  Postop diagnosis:  1. same  Surgical procedure: Laparoscopic Roux-en-Y gastric bypass (ante-colic, ante-gastric); upper endoscopy  Surgeon: [name], M.D. FACS  Asst.: Glenna Fellows, MD FACS  Anesthesia: General plus 60cc 0.25% marcaine with epi  Complications: None   EBL: Minimal   Specimen: lesser curve mesenteric mass  Drains: None   Disposition: PACU in good condition   Indications for procedure: 61yo WF with morbid obesity who has been unsuccessful at sustained weight loss. The patient's comorbidities are listed above. We discussed the risk and benefits of surgery including but not limited to anesthesia risk, bleeding, infection, blood clot formation, anastomotic leak, anastomotic stricture, ulcer formation, death, respiratory complications, intestinal blockage, internal hernia, gallstone formation, vitamin and nutritional deficiencies, injury to surrounding structures, failure to lose weight and mood changes.   Description of procedure: Patient is brought to the operating room and general anesthesia induced. The patient had received preoperative broad-spectrum IV antibiotics and subcutaneous heparin and IV tylenol. The abdomen was widely sterilely prepped with Chloraprep and draped. Patient timeout was performed and correct patient and procedure confirmed. Access was obtained with a 12 mm Optiview trocar in the left upper quadrant and pneumoperitoneum established without difficulty. Under direct vision 12 mm trocars were placed laterally in the right upper quadrant, right upper quadrant midclavicular line, and to the left and above the umbilicus for the camera port. A 5 mm trocar was placed laterally in the left upper quadrant.  The omentum was brought into the upper abdomen and the transverse mesocolon elevated and the ligament of Treitz clearly identified. A 40 cm biliopancreatic limb  was then carefully measured from the ligament of Treitz. The small intestine was divided at this point with a single firing of the white load linear stapler. A Penrose drain was sutured to the end of the Roux-en-Y limb for later identification. A 100 cm Roux-en-Y limb was then carefully measured. At this point a side-to-side anastomosis was created between the Roux limb and the end of the biliopancreatic limb. This was accomplished with a single firing of the 45 mm white load linear stapler. The common enterotomy was closed with a running 2-0 Vicryl begun at either end of the enterotomy and tied centrally. The anastomosis was coated with Tisseel sealant. The mesenteric defect was then closed with running 2-0 silk. The omentum was then divided with the harmonic scalpel up towards the transverse colon to allow mobility of the Roux limb toward the gastric pouch. The patient was then placed in steep reversed Trendelenburg. Through a 5 mm subxiphoid site the [facility] retractor was placed and the left lobe of the liver elevated with excellent exposure of the upper stomach and hiatus. The angle of Hiss was then mobilized with the harmonic scalpel. A 4 cm gastric pouch was then carefully measured along the lesser curve of the stomach. Dissection was carried along the lesser curve at this point with the Harmonic scalpel working carefully back toward the lesser sac at right angles to the lesser curve. The free lesser sac was then entered. After being sure all tubes were removed from the stomach an initial firing of the gold load 60 mm linear stapler was fired at right angles across the lesser curve for about 4 cm. The gastric pouch was further mobilized posteriorly and then the pouch was completed with 2 further firings of the 60 mm blue load linear stapler up through the previously  dissected angle of His. It was ensured that the pouch was completely mobilized away from the gastric remnant. This created a nice tubular 4-5 cm  gastric pouch. The Roux limb was then brought up in an antecolic fashion with the candycane facing to the patient's left without undue tension. The gastrojejunostomy was created with an initial posterior row of 2-0 Vicryl between the Roux limb and the staple line of the gastric pouch. Enterotomies were then made in the gastric pouch and the Roux limb with the harmonic scalpel and at approximately 2-2-1/2 cm anastomosis was created with a single firing of the 45mm blue load linear stapler. The staple line was inspected and was intact without bleeding. The common enterotomy was then closed with running 2-0 Vicryl begun at either end and tied centrally. The Ewall tube was then easily passed through the anastomosis and an outer anterior layer of running 2-0 Vicryl was placed. The Ewald tube was removed. With the outlet of the gastrojejunostomy clamped and under saline irrigation the assistant performed upper endoscopy and with the gastric pouch tensely distended with air-there was no evidence of leak on this test. The pouch was desufflated. The Vonita Moss defect was closed with running 2-0 silk. The abdomen was inspected for any evidence of bleeding or bowel injury and everything looked fine. The Nathanson retractor was removed under direct vision after coating the anastomosis with Tisseel tissue sealant. All CO2 was evacuated and trochars removed. Skin incisions were closed with staples and bandages. Sponge needle and instrument counts were correct. The patient was taken to the PACU in good condition.    [name] [name]. [name] Campanile, MD, FACS General, Bariatric, & Minimally Invasive Surgery [facility], [place]

## 2012-03-09 NOTE — Transfer of Care (Signed)
Immediate Anesthesia Transfer of Care Note  Patient: [name] [name]  Procedure(s) Performed: Procedure(s) (LRB) with comments: LAPAROSCOPIC ROUX-EN-Y GASTRIC (N/A) UPPER GI ENDOSCOPY (N/A)  Patient Location: PACU  Anesthesia Type:General  Level of Consciousness: awake, alert  and oriented  Airway & Oxygen Therapy: Patient Spontanous Breathing and Patient connected to face mask oxygen  Post-op Assessment: Report given to PACU RN and Post -op Vital signs reviewed and stable  Post vital signs: Reviewed and stable  Complications: No apparent anesthesia complications

## 2012-03-10 ENCOUNTER — Inpatient Hospital Stay (HOSPITAL_COMMUNITY): Payer: BC Managed Care – PPO

## 2012-03-10 ENCOUNTER — Encounter (HOSPITAL_COMMUNITY): Payer: Self-pay | Admitting: General Surgery

## 2012-03-10 LAB — HEMOGLOBIN AND HEMATOCRIT, BLOOD
HCT: 30.3 % — ABNORMAL LOW (ref 36.0–46.0)
Hemoglobin: 10.2 g/dL — ABNORMAL LOW (ref 12.0–15.0)

## 2012-03-10 LAB — CBC WITH DIFFERENTIAL/PLATELET
Basophils Absolute: 0 10*3/uL (ref 0.0–0.1)
Basophils Relative: 0 % (ref 0–1)
HCT: 32.8 % — ABNORMAL LOW (ref 36.0–46.0)
Lymphocytes Relative: 14 % (ref 12–46)
MCHC: 32.6 g/dL (ref 30.0–36.0)
Monocytes Absolute: 0.6 10*3/uL (ref 0.1–1.0)
Neutro Abs: 6.9 10*3/uL (ref 1.7–7.7)
Neutrophils Relative %: 78 % — ABNORMAL HIGH (ref 43–77)
RDW: 14.7 % (ref 11.5–15.5)
WBC: 8.8 10*3/uL (ref 4.0–10.5)

## 2012-03-10 MED ORDER — IOHEXOL 300 MG/ML  SOLN: 50.0000 mL | Freq: Once | INTRAMUSCULAR | Status: AC | PRN

## 2012-03-10 MED ORDER — ZOLPIDEM TARTRATE 5 MG PO TABS
5.0000 mg | ORAL_TABLET | Freq: Every evening | ORAL | Status: DC | PRN
  Administered 2012-03-10: 5 mg via ORAL
  Filled 2012-03-10: qty 1

## 2012-03-10 NOTE — Care Management Note (Signed)
    Page 1 of 1   03/10/2012     11:03:09 AM   CARE MANAGEMENT NOTE 03/10/2012  Patient:  [name] [name], [name] [name]   Account Number:  [id number]  Date Initiated:  03/10/2012  Documentation initiated by:  Lorenda Ishihara  Subjective/Objective Assessment:   61 yo female admitted s/p gastric bypass. PTA lived at home with spouse.     Action/Plan:   d/c home whe stable   Anticipated DC Date:  03/11/2012   Anticipated DC Plan:  HOME/SELF CARE      DC Planning Services  CM consult      Choice offered to / List presented to:             Status of service:  Completed, signed off Medicare Important Message given?   (If response is "NO", the following Medicare IM given date fields will be blank) Date Medicare IM given:   Date Additional Medicare IM given:    Discharge Disposition:  HOME/SELF CARE  Per UR Regulation:  Reviewed for med. necessity/level of care/duration of stay  If discussed at Long Length of Stay Meetings, dates discussed:    Comments:

## 2012-03-10 NOTE — Progress Notes (Signed)
UGI results called to Dr. Andrey Campanile; orders received to advance to ice chips and sips of water; Gunnar Fusi, RN notified of results and orders.; Pt aware as well. Joen Laura, RN Bariatric nurse Coordinator

## 2012-03-10 NOTE — Progress Notes (Signed)
BP 107/69  Pulse 67  Temp 98.8 F (37.1 C) (Oral)  Resp 18  Ht 5\' 2"  (1.575 m)  Wt 237 lb (107.502 kg)  BMI 43.35 kg/m2  SpO2 95%  Swallow reviewed. No evid of leak.  Had another episode of transient hypoTN No tachycardia, fever.  C/o of abd wall soreness, no n/v. Tolerating water. No flatus. No dizziness with walking  Alert, nad. HR 66 on pulse ox abd soft, obese, dressing c/d/i. Expected mild ttp. uop ok  Stable.  F/u hgb/hct at 4pm Not sure of etiology of intermittent hypoTN - pain meds vs bleed vs other. hgb was 11.9 now 10.7 this am. F/u hgb at 4pm.   [name]. Andrey Campanile, MD, FACS General, Bariatric, & Minimally Invasive Surgery [facility], [place]

## 2012-03-10 NOTE — Progress Notes (Signed)
1 Day Post-Op  Subjective: Sore on left abd wall. Walked multiple times yesterday and early this am at around 0400. No n/v/regurg/flatus  Objective: Vital signs in last 24 hours: Temp:  [97.2 F (36.2 C)-98.7 F (37.1 C)] 98.2 F (36.8 C) (10/30 0523) Pulse Rate:  [62-78] 62  (10/30 0523) Resp:  [11-18] 18  (10/30 0523) BP: (92-137)/(50-83) 132/56 mmHg (10/30 0523) SpO2:  [94 %-100 %] 98 % (10/30 0523) Weight:  [237 lb (107.502 kg)] 237 lb (107.502 kg) (10/29 1421)    Intake/Output from previous day: 10/29 0701 - 10/30 0700 In: 4566.7 [I.V.:4366.7; IV Piggyback:200] Out: 1550 [Urine:1450; Blood:100] Intake/Output this shift:    Alert, nad, smiling cta b/l Reg Obese, soft, some TTP in LUQ/mid abdominal wall. Dressings intact No edema  Lab Results:   Basename 03/10/12 0455 03/09/12 1436  WBC 8.8 --  HGB 10.7* 11.9*  HCT 32.8* 36.1  PLT 324 --   BMET No results found for this basename: NA:2,K:2,CL:2,CO2:2,GLUCOSE:2,BUN:2,CREATININE:2,CALCIUM:2 in the last 72 hours PT/INR No results found for this basename: LABPROT:2,INR:2 in the last 72 hours ABG No results found for this basename: PHART:2,PCO2:2,PO2:2,HCO3:2 in the last 72 hours  Studies/Results: No results found.  Anti-infectives: Anti-infectives     Start     Dose/Rate Route Frequency Ordered Stop   03/09/12 0712   cefOXitin (MEFOXIN) 2 g in dextrose 5 % 50 mL IVPB        2 g 100 mL/hr over 30 Minutes Intravenous 60 min pre-op 03/09/12 4098 03/09/12 0920          Assessment/Plan: s/p Procedure(s) (LRB) with comments: LAPAROSCOPIC ROUX-EN-Y GASTRIC (N/A) UPPER GI ENDOSCOPY (N/A)   Vitals ok. Transient mild hypotension overnight. Normal BP this am. No tachycardia or fever Swallow this am Ambulate, pulm toilet Cont lovenox. Monitor hgb/hct - down slightly. Repeat this pm.  [name]. Andrey Campanile, MD, FACS General, Bariatric, & Minimally Invasive Surgery [facility], [place]   LOS: 1 day     [name] 03/10/2012

## 2012-03-10 NOTE — Progress Notes (Signed)
Pt alert and oriented; a bit tearful this am; VSS; denies any nausea or vomiting; remains NPO for UGI this am; burping; denies flatus or BM at this time; voiding without difficulty; ambulating in hallways without difficulty; using incentive spirometer as directed; c/o some abdominal soreness with relief from prn meds; pt already has follow up appts with [facility] and [facility]; aware of support group and BELT program; discharge instructions given for pt to review; continue current plan of care. GASTRIC BYPASS/SLEEVE DISCHARGE INSTRUCTIONS  Drs. Fredrik Rigger, Hoxworth, Wilson, and [place] Call if you have any problems.   Call [phone] and ask for the surgeon on call.    If you need immediate assistance come to the ER at [facility]. Tell the ER personnel that you are a new post-op gastric bypass patient. Signs and symptoms to report:   Severe vomiting or nausea. If you cannot tolerate clear liquids for longer than 1 day, you need to call your surgeon.    Abdominal pain which does not get better after taking your pain medication   Fever greater than 101 F degree   Difficulty breathing   Chest pain    Redness, swelling, drainage, or foul odor at incision sites    If your incisions open or pull apart   Swelling or pain in calf (lower leg)   Diarrhea, frequent watery, uncontrolled bowel movements.   Constipation, (no bowel movements for 3 days) if this occurs, Take Milk of Magnesia, 2 tablespoons by mouth, 3 times a day for 2 days if needed.  Call your doctor if constipation continues. Stop taking Milk of Magnesia once you have had a bowel movement. You may also use Miralax according to the label instructions.   Anything you consider "abnormal for you".   Normal side effects after Surgery:   Unable to sleep at night or concentrate   Irritability   Being tearful (crying) or depressed   These are common complaints, possibly related to your anesthesia, stress of surgery and change in lifestyle, that  usually go away a few weeks after surgery.  If these feelings continue, call your medical doctor.  Wound Care You may have surgical glue, steri-strips, or staples over your incisions after surgery.  Surgical glue:  Looks like a clear film over your incisions and will wear off gradually. Steri-strips: Strips of tape over your incisions. You may notice a yellowish color on the skin underneath the steri-strips. This is a substance used to make the steri-strips stick better. Do not pull the steri-strips off - let them fall off.  Staples: Cherlynn Polo may be removed before you leave the hospital. If you go home with staples, call [facility] [phone]) for an appointment with your surgeon's nurse to have staples removed in 7 - 10 days. Showering: You may shower two days after your surgery unless otherwise instructed by your surgeon. Wash gently around wounds with warm soapy water, rinse well, and gently pat dry.  If you have a drain, you may need someone to hold this while you shower. Avoid tub baths until staples are removed and incisions are healed.    Medications   Medications should be liquid or crushed if larger than the size of a dime.  Extended release pills should not be crushed.   Depending on the size and number of medications you take, you may need to stagger/change the time you take your medications so that you do not over-fill your pouch.    Make sure you follow-up with your  primary care physician to make medication adjustments needed during rapid weight loss and life-style adjustment.   If you are diabetic, follow up with the doctor that prescribes your diabetes medication(s) within one week after surgery and check your blood sugar regularly.   Do not drive while taking narcotics!   Do not take acetaminophen (Tylenol) and Roxicet or Lortab Elixir at the same time since these pain medications contain acetaminophen.  Diet at home: (First 2 Weeks) You will see the nutritionist  two weeks after your surgery. She will advance your diet if you are tolerating liquids well. Once at home, if you have severe vomiting or nausea and cannot tolerate clear liquids lasting longer than 1 day, call your surgeon.  Begin high protein shake 2 ounces every 3 hours, 5 - 6 times per day.  Gradually increase the amount you drink as tolerated.  You may find it easier to slowly sip shakes throughout the day.  It is important to get your proteins in first.   Protein Shake   Drink at least 2 ounces of shake 5-6 times per day   Each serving of protein shakes should have a minimum of 15 grams of protein and no more than 5 grams of carbohydrate    Increase the amount of protein shake you drink as tolerated   Protein powder may be added to fluids such as non-fat milk or Lactaid milk (limit to 20 grams added protein powder per serving   The initial goal is to drink at least 8 ounces of protein shake/drink per day (or as directed by the nutritionist). Some examples of protein shakes are [employer], [employer]illard's, EAS Edge HP, and Unjury. Hydration   Gradually increase the amount of water and other liquids as tolerated (See Acceptable Fluids)   Gradually increase the amount of protein shake as tolerated     Sip fluids slowly and throughout the day   May use Sugar substitutes, use sparingly (limit to 6 - 8 packets per day). Your fluid goal is 64 ounces of fluid daily. It may take a few weeks to build up to this.         32 oz (or more) should be clear liquids and 32 oz (or more) should be full liquids.         Liquids should not contain sugar, caffeine, or carbonation! Acceptable Fluids Clear Liquids:   Water or Sugar-free flavored water, Fruit H2O   Decaffeinated coffee or tea (sugar-free)   Crystal Lite, Wyler's Lite, Minute Maid Lite   Sugar-free Jell-O   Bouillon or broth   Sugar-free Popsicle:   *Less than 20 calories each; Limit 1 per day   Full Liquids:              Protein  Shakes/Drinks + 2 choices per day of other full liquids shown below.    Other full liquids must be: No more than 12 grams of Carbs per serving,  No more than 3 grams of Fat per serving   Strained low-fat cream soup   Non-Fat milk   Fat-free Lactaid Milk   Sugar-free yogurt (Dannon Lite & Fit) Vitamins and Minerals (Start 1 day after surgery unless otherwise directed)   2 Chewable Multivitamin / Multimineral Supplement (i.e. Centrum for Adults)   Chewable Calcium Citrate with Vitamin D-3. Take 1500 mg each day.           (Example: 3 Chewable Calcium Plus 600 with Vitamin D-3 can be found at [facility])  Vitamin B-12, 350 - 500 micrograms (oral tablet) each day   Do not mix multivitamins containing iron with calcium supplements; take 2 hours   apart   Do not substitute Tums (calcium carbonate) for your calcium   Menstruating women and those at risk for anemia may need extra iron. Talk with your doctor to see if you need additional iron.    If you need extra iron:  Total daily Iron recommendations (including Vitamins) = 50 - 100 mg Iron/day Do not stop taking or change any vitamins or minerals until you talk to your nutritionist or surgeon. Your nutritionist and / or physician must approve all vitamin and mineral supplements. Exercise For maximum success, begin exercising as soon as your doctor recommends. Make sure your physician approves any physical activity.   Depending on fitness level, begin with a simple walking program   Walk 5-15 minutes each day, 7 days per week.    Slowly increase until you are walking 30-45 minutes per day   Consider joining our BELT program. [phone] or email [email or web address] Things to remember:    You may have sexual relations when you feel comfortable. It is VERY important for female patients to use a reliable birth control method. Fertility often increases after surgery. Do not get pregnant for at least 18 months.   It is very important to keep all follow  up appointments with your surgeon, nutritionist, primary care physician, and behavioral health practitioner. After the first year, please follow up with your bariatric surgeon at least once a year in order to maintain best weight loss results.  [facility]: [phone] [facility]: [phone]   Free counseling is available for you and your family through collaboration between [facility] and [place]. Please call [phone] and leave a message.    Consider purchasing a medical alert bracelet that says you had gastric bypass surgery.    [facility] has a free Bariatric Surgery Support Group that meets monthly, the 3rd Thursday, 6 pm, Classroom #1, [employer]. You may register online at [email or web address], but registration is not necessary. Select Classes and Support Groups, Bariatric Surgery, or Call [phone]   Do not return to work or drive until cleared by your surgeon   Use your CPAP when sleeping if applicable   Do not lift anything greater than ten pounds for at least two weeks   Joen Laura, RN Bariatric Nurse Coordinator

## 2012-03-11 LAB — CBC WITH DIFFERENTIAL/PLATELET
Basophils Absolute: 0 10*3/uL (ref 0.0–0.1)
Eosinophils Relative: 2 % (ref 0–5)
Lymphocytes Relative: 14 % (ref 12–46)
Lymphs Abs: 1.4 10*3/uL (ref 0.7–4.0)
Neutro Abs: 7.7 10*3/uL (ref 1.7–7.7)
Neutrophils Relative %: 76 % (ref 43–77)
Platelets: 317 10*3/uL (ref 150–400)
RBC: 3.87 MIL/uL (ref 3.87–5.11)
RDW: 15.1 % (ref 11.5–15.5)
WBC: 10.1 10*3/uL (ref 4.0–10.5)

## 2012-03-11 MED ORDER — OXYCODONE-ACETAMINOPHEN 5-325 MG/5ML PO SOLN: 5.0000 mL | ORAL | Status: DC | PRN

## 2012-03-11 MED ORDER — FLUOXETINE HCL 20 MG/5ML PO SOLN
20.0000 mg | Freq: Every day | ORAL | Status: DC
  Administered 2012-03-11: 20 mg via ORAL
  Filled 2012-03-11: qty 5

## 2012-03-11 MED ORDER — PROPRANOLOL HCL 20 MG/5ML PO SOLN: 20.0000 mg | Freq: Three times a day (TID) | ORAL | Status: DC

## 2012-03-11 MED ORDER — PROPRANOLOL HCL 20 MG/5ML PO SOLN: 10.0000 mg | Freq: Three times a day (TID) | ORAL | Status: DC

## 2012-03-11 MED ORDER — BUPROPION HCL 100 MG PO TABS
100.0000 mg | ORAL_TABLET | Freq: Three times a day (TID) | ORAL | Status: DC
  Administered 2012-03-11: 100 mg via ORAL
  Filled 2012-03-11 (×3): qty 1

## 2012-03-11 MED ORDER — PANTOPRAZOLE SODIUM 40 MG PO TBEC: 40.0000 mg | DELAYED_RELEASE_TABLET | Freq: Every day | ORAL | Status: DC

## 2012-03-11 NOTE — Progress Notes (Signed)
Pt alert and oriented; VSS; pt states she is much better today; denies any nausea or vomiting; tolerating water and ice chips well; will advance to protein shakes today; burping; +flatus; denies BM at this time; voiding without difficulty; ambulating in hallways without difficulty; using incentive spirometer as directed; pt already has follow up appts with [facility] and [facility]; aware of support group and BELT program; discharge instructions reviewed and pt verbalized understanding of; questions answered.  GASTRIC BYPASS/SLEEVE DISCHARGE INSTRUCTIONS  Drs. Fredrik Rigger, Hoxworth, Wilson, and [place] Call if you have any problems.   Call [phone] and ask for the surgeon on call.    If you need immediate assistance come to the ER at [facility]. Tell the ER personnel that you are a new post-op gastric bypass patient. Signs and symptoms to report:   Severe vomiting or nausea. If you cannot tolerate clear liquids for longer than 1 day, you need to call your surgeon.    Abdominal pain which does not get better after taking your pain medication   Fever greater than 101 F degree   Difficulty breathing   Chest pain    Redness, swelling, drainage, or foul odor at incision sites    If your incisions open or pull apart   Swelling or pain in calf (lower leg)   Diarrhea, frequent watery, uncontrolled bowel movements.   Constipation, (no bowel movements for 3 days) if this occurs, Take Milk of Magnesia, 2 tablespoons by mouth, 3 times a day for 2 days if needed.  Call your doctor if constipation continues. Stop taking Milk of Magnesia once you have had a bowel movement. You may also use Miralax according to the label instructions.   Anything you consider "abnormal for you".   Normal side effects after Surgery:   Unable to sleep at night or concentrate   Irritability   Being tearful (crying) or depressed   These are common complaints, possibly related to your anesthesia, stress of surgery and change in  lifestyle, that usually go away a few weeks after surgery.  If these feelings continue, call your medical doctor.  Wound Care You may have surgical glue, steri-strips, or staples over your incisions after surgery.  Surgical glue:  Looks like a clear film over your incisions and will wear off gradually. Steri-strips: Strips of tape over your incisions. You may notice a yellowish color on the skin underneath the steri-strips. This is a substance used to make the steri-strips stick better. Do not pull the steri-strips off - let them fall off.  Staples: Cherlynn Polo may be removed before you leave the hospital. If you go home with staples, call [facility] [phone]) for an appointment with your surgeon's nurse to have staples removed in 7 - 10 days. Showering: You may shower two days after your surgery unless otherwise instructed by your surgeon. Wash gently around wounds with warm soapy water, rinse well, and gently pat dry.  If you have a drain, you may need someone to hold this while you shower. Avoid tub baths until staples are removed and incisions are healed.    Medications   Medications should be liquid or crushed if larger than the size of a dime.  Extended release pills should not be crushed.   Depending on the size and number of medications you take, you may need to stagger/change the time you take your medications so that you do not over-fill your pouch.    Make sure you follow-up with your primary care physician  to make medication adjustments needed during rapid weight loss and life-style adjustment.   If you are diabetic, follow up with the doctor that prescribes your diabetes medication(s) within one week after surgery and check your blood sugar regularly.   Do not drive while taking narcotics!   Do not take acetaminophen (Tylenol) and Roxicet or Lortab Elixir at the same time since these pain medications contain acetaminophen.  Diet at home: (First 2 Weeks) You will see the  nutritionist two weeks after your surgery. She will advance your diet if you are tolerating liquids well. Once at home, if you have severe vomiting or nausea and cannot tolerate clear liquids lasting longer than 1 day, call your surgeon.  Begin high protein shake 2 ounces every 3 hours, 5 - 6 times per day.  Gradually increase the amount you drink as tolerated.  You may find it easier to slowly sip shakes throughout the day.  It is important to get your proteins in first.   Protein Shake   Drink at least 2 ounces of shake 5-6 times per day   Each serving of protein shakes should have a minimum of 15 grams of protein and no more than 5 grams of carbohydrate    Increase the amount of protein shake you drink as tolerated   Protein powder may be added to fluids such as non-fat milk or Lactaid milk (limit to 20 grams added protein powder per serving   The initial goal is to drink at least 8 ounces of protein shake/drink per day (or as directed by the nutritionist). Some examples of protein shakes are [employer], [employer]illard's, E[employer] Edge HP, and Unjury. Hydration   Gradually increase the amount of water and other liquids as tolerated (See Acceptable Fluids)   Gradually increase the amount of protein shake as tolerated     Sip fluids slowly and throughout the day   May use Sugar substitutes, use sparingly (limit to 6 - 8 packets per day). Your fluid goal is 64 ounces of fluid daily. It may take a few weeks to build up to this.         32 oz (or more) should be clear liquids and 32 oz (or more) should be full liquids.         Liquids should not contain sugar, caffeine, or carbonation! Acceptable Fluids Clear Liquids:   Water or Sugar-free flavored water, Fruit H2O   Decaffeinated coffee or tea (sugar-free)   Crystal Lite, Wyler's Lite, Minute Maid Lite   Sugar-free Jell-O   Bouillon or broth   Sugar-free Popsicle:   *Less than 20 calories each; Limit 1 per day   Full Liquids:               Protein Shakes/Drinks + 2 choices per day of other full liquids shown below.    Other full liquids must be: No more than 12 grams of Carbs per serving,  No more than 3 grams of Fat per serving   Strained low-fat cream soup   Non-Fat milk   Fat-free Lactaid Milk   Sugar-free yogurt (Dannon Lite & Fit) Vitamins and Minerals (Start 1 day after surgery unless otherwise directed)   2 Chewable Multivitamin / Multimineral Supplement (i.e. Centrum for Adults)   Chewable Calcium Citrate with Vitamin D-3. Take 1500 mg each day.           (Example: 3 Chewable Calcium Plus 600 with Vitamin D-3 can be found at [facility])  Vitamin B-12, 350 - 500 micrograms (oral tablet) each day   Do not mix multivitamins containing iron with calcium supplements; take 2 hours   apart   Do not substitute Tums (calcium carbonate) for your calcium   Menstruating women and those at risk for anemia may need extra iron. Talk with your doctor to see if you need additional iron.    If you need extra iron:  Total daily Iron recommendations (including Vitamins) = 50 - 100 mg Iron/day Do not stop taking or change any vitamins or minerals until you talk to your nutritionist or surgeon. Your nutritionist and / or physician must approve all vitamin and mineral supplements. Exercise For maximum success, begin exercising as soon as your doctor recommends. Make sure your physician approves any physical activity.   Depending on fitness level, begin with a simple walking program   Walk 5-15 minutes each day, 7 days per week.    Slowly increase until you are walking 30-45 minutes per day   Consider joining our BELT program. [phone] or email [email or web address] Things to remember:    You may have sexual relations when you feel comfortable. It is VERY important for female patients to use a reliable birth control method. Fertility often increases after surgery. Do not get pregnant for at least 18 months.   It is very important to keep all  follow up appointments with your surgeon, nutritionist, primary care physician, and behavioral health practitioner. After the first year, please follow up with your bariatric surgeon at least once a year in order to maintain best weight loss results.  [facility]: [phone] [facility]: [phone]   Free counseling is available for you and your family through collaboration between [facility] and [facility]. Please call [phone] and leave a message.    Consider purchasing a medical alert bracelet that says you had gastric bypass surgery.    [facility] has a free Bariatric Surgery Support Group that meets monthly, the 3rd Thursday, 6 pm, Classroom #1, [employer]. You may register online at [email or web address], but registration is not necessary. Select Classes and Support Groups, Bariatric Surgery, or Call [phone]   Do not return to work or drive until cleared by your surgeon   Use your CPAP when sleeping if applicable   Do not lift anything greater than ten pounds for at least two weeks  Joen Laura, RN BAriatric Nurse coordinator

## 2012-03-11 NOTE — Progress Notes (Signed)
2 Days Post-Op  Subjective: Slept much better. No n/v. +flatus. Pain ok. Ambulated a lot. No lightheadedness or dizziness  Objective: Vital signs in last 24 hours: Temp:  [98.3 F (36.8 C)-99 F (37.2 C)] 98.6 F (37 C) (10/31 0535) Pulse Rate:  [63-74] 74  (10/31 0535) Resp:  [16-18] 18  (10/31 0535) BP: (70-118)/(41-69) 116/57 mmHg (10/31 0535) SpO2:  [94 %-97 %] 96 % (10/31 0535) Last BM Date: 03/08/12  Intake/Output from previous day: 10/30 0701 - 10/31 0700 In: 3000 [I.V.:3000] Out: 1400 [Urine:1400] Intake/Output this shift:    Alert, nad cta Reg Obese, soft, mild TTP, incision c/d/i No edema  Lab Results:   Basename 03/11/12 0425 03/10/12 1555 03/10/12 0455  WBC 10.1 -- 8.8  HGB 10.6* 10.2* --  HCT 32.4* 30.3* --  PLT 317 -- 324   BMET No results found for this basename: NA:2,K:2,CL:2,CO2:2,GLUCOSE:2,BUN:2,CREATININE:2,CALCIUM:2 in the last 72 hours PT/INR No results found for this basename: LABPROT:2,INR:2 in the last 72 hours ABG No results found for this basename: PHART:2,PCO2:2,PO2:2,HCO3:2 in the last 72 hours  Studies/Results: Dg Ugi W/water Sol Cm  03/10/2012  *RADIOLOGY REPORT*  Clinical Data:  Post gastric bypass.  UPPER GI SERIES WITH KUB  Technique:  Routine upper GI series was performed with 50 ml Omnipaque-300.  Fluoroscopy Time: 53 seconds  Comparison:  01/22/2012.  Findings: Scout view reveals dextroscoliosis of the lumbar spine with degenerative changes along the concavity.  Prior surgery lower lumbar spine.  Post gastric bypass without leak identified from the proximal anastomotic site. Narrowing of the proximal anastomosis may be related to postoperative edema.  Contrast traversed throughout proximal small bowel however distal anastomoses not delineated after 10-minute delayed imaging.  IMPRESSION: Post gastric bypass without leak identified from the proximal anastomotic site. Narrowing of the proximal anastomosis may be related to postoperative  edema.  Contrast traversed throughout proximal small bowel however distal anastomoses not delineated after 10-minute delayed imaging.   Original Report Authenticated By: Fuller Canada, M.D.     Anti-infectives: Anti-infectives     Start     Dose/Rate Route Frequency Ordered Stop   03/09/12 0712   cefOXitin (MEFOXIN) 2 g in dextrose 5 % 50 mL IVPB        2 g 100 mL/hr over 30 Minutes Intravenous 60 min pre-op 03/09/12 4098 03/09/12 0920          Assessment/Plan: s/p Procedure(s) (LRB) with comments: LAPAROSCOPIC ROUX-EN-Y GASTRIC (N/A) UPPER GI ENDOSCOPY (N/A)  Looks good.  Adv to POD 2 diet Cont ambulation hgb stable BP stable Will hold BP meds for now and on discharge If tolerates shakes - home later today Will ask nutrition to come by and answer pt's questions  [name]. Andrey Campanile, MD, FACS General, Bariatric, & Minimally Invasive Surgery [facility], [place]ys[place]   LOS: 2 days    [name] 03/11/2012

## 2012-03-12 NOTE — Discharge Summary (Signed)
Physician Discharge Summary  [name] [name] CZY:[id number] DOB: 07-30-1950 DOA: 03/09/2012  PCP: Herb Grays, MD  Admit date: 03/09/2012 Discharge date: 03/11/2012  Recommendations for Outpatient Follow-up:  1. Check your blood pressure daily - will likely need to restart your blood pressure medication in the next few days  2. Follow the postop gastric bypass diet  Follow-up Information    Follow up with [name], MD,FACS. On 03/19/2012. (9:30 AM)    Contact information:   [address] [place] [address] [phone]         Discharge Diagnoses:  1. S/p Laparoscopic roux-en-y gastric bypass 2. Morbid obesity 3. Hypertension 4. OA 5. HPL  Surgical Procedure: laparoscopic Roux-en-Y gastric bypass, upper endoscopy  Discharge Condition: good Disposition: discharge to home  Diet recommendation: postop gastric bypass  Filed Weights   03/09/12 1421  Weight: 237 lb (107.502 kg)    Hospital Course:  61 year old morbidly obese Caucasian female with hypertension, hyperlipidemia, arthritis was admitted for a planned laparoscopic Roux-en-Y gastric bypass which she underwent on October 29.her postoperative course was unremarkable. She was maintained on perioperative chemical DVT prophylaxis. She was ambulating within several hours of surgery. On the morning of postoperative day 1, she underwent an upper GI which demonstrated no signs of contrast extravasation. Her gastrojejunal anastomosis was patent. She was afebrile and not tachycardic. She was started on ice chips and sips of water. She had during her hospitalization about 3 documented blood pressures where the systolic blood pressure was less than 90 mm mercury. However she never became tachycardic. She was not receiving her antihypertensive blood pressure medications. She was asymptomatic. On postoperative day 2, she was advanced to protein shakes which she tolerated. She is a lady without difficulty. She was  having flatus. Her vital signs are stable. Her incisions were clean dry and intact. Her abdomen was soft, nondistended, incisions were clean dry and intact. Her lungs were clear to auscultation. She had no lower extremity edema. She was deemed stable for discharge. I recommended to the patient that we hold her blood pressure medicine on discharge for a few days. She was instructed to check her blood pressure every day. I instructed her to resume her blood pressure medicine once her systolic was greater than 140 and/or diastolic greater than 90. She was given wound care and discharge instructions.   Discharge Instructions  Discharge Orders    Future Appointments: Provider: Department: Dept Phone: Center:   03/19/2012 9:30 AM [name], MD,FACS [facility] [place] [phone] None   03/24/2012 4:00 PM [facility] Post-Op Class [facility] [phone] NDM   04/21/2012 2:15 PM Wh[facility]Mm 1 [facility] [phone]   04/30/2012 9:30 AM [name], MS, RD [facility] [phone] NDM     Future Orders Please Complete By Expires   Increase activity slowly          Medication List     As of 03/12/2012  3:48 PM    STOP taking these medications         aspirin 81 MG tablet      buPROPion 150 MG 24 hr tablet   Commonly known as: WELLBUTRIN XL      calcium gluconate 500 MG tablet      diclofenac 75 MG EC tablet   Commonly known as: VOLTAREN      estradiol 0.5 MG tablet   Commonly known as: ESTRACE      hydrochlorothiazide 25 MG tablet   Commonly  known as: HYDRODIURIL      peg 3350 powder 100 G Solr   Commonly known as: MOVIPREP      propranolol 20 MG/5ML solution   Commonly known as: INDERAL      TAKE these medications         atorvastatin 20 MG tablet   Commonly known as: LIPITOR   Take 20 mg by mouth at bedtime.      buPROPion 100 MG tablet   Commonly known as: WELLBUTRIN   Take 100 mg by mouth 3 (three) times daily.      eszopiclone 3  MG Tabs   Generic drug: Eszopiclone   Take 3 mg by mouth at bedtime. Take immediately before bedtime      FLUoxetine 20 MG/5ML solution   Commonly known as: PROZAC   Take by mouth daily.      oxyCODONE-acetaminophen 5-325 MG/5ML solution   Commonly known as: ROXICET   Take 5-10 mLs by mouth every 4 (four) hours as needed.      pantoprazole 40 MG tablet   Commonly known as: PROTONIX   Take 1 tablet (40 mg total) by mouth daily.           Follow-up Information    Follow up with [name], MD,FACS. On 03/19/2012. (9:30 AM)    Contact information:   [address] [place] [address] [phone]           The results of significant diagnostics from this hospitalization (including imaging, microbiology, ancillary and laboratory) are listed below for reference.    Significant Diagnostic Studies: Dg Ugi W/water Sol Cm  03/10/2012  *RADIOLOGY REPORT*  Clinical Data:  Post gastric bypass.  UPPER GI SERIES WITH KUB  Technique:  Routine upper GI series was performed with 50 ml Omnipaque-300.  Fluoroscopy Time: 53 seconds  Comparison:  01/22/2012.  Findings: Scout view reveals dextroscoliosis of the lumbar spine with degenerative changes along the concavity.  Prior surgery lower lumbar spine.  Post gastric bypass without leak identified from the proximal anastomotic site. Narrowing of the proximal anastomosis may be related to postoperative edema.  Contrast traversed throughout proximal small bowel however distal anastomoses not delineated after 10-minute delayed imaging.  IMPRESSION: Post gastric bypass without leak identified from the proximal anastomotic site. Narrowing of the proximal anastomosis may be related to postoperative edema.  Contrast traversed throughout proximal small bowel however distal anastomoses not delineated after 10-minute delayed imaging.   Original Report Authenticated By: Fuller Canada, M.D.     Microbiology: Recent Results (from the past 240  hour(s))  SURGICAL PCR SCREEN     Status: Normal   Collection Time   03/03/12  9:15 AM      Component Value Range Status Comment   MRSA, PCR NEGATIVE  NEGATIVE Final    Staphylococcus aureus NEGATIVE  NEGATIVE Final      Labs: CBC:  Lab 03/11/12 0425 03/10/12 1555 03/10/12 0455 03/09/12 1436  WBC 10.1 -- 8.8 --  NEUTROABS 7.7 -- 6.9 --  HGB 10.6* 10.2* 10.7* 11.9*  HCT 32.4* 30.3* 32.8* 36.1  MCV 83.7 -- 83.2 --  PLT 317 -- 324 --    Active Problems:  Hypertension  Obesity   Time coordinating discharge: 20 minutes  Signed:  [name], MD [facility] Surgery, [place] [phone] 03/12/2012, 3:48 PM

## 2012-03-19 ENCOUNTER — Ambulatory Visit (INDEPENDENT_AMBULATORY_CARE_PROVIDER_SITE_OTHER): Payer: BC Managed Care – PPO | Admitting: General Surgery

## 2012-03-19 ENCOUNTER — Encounter (INDEPENDENT_AMBULATORY_CARE_PROVIDER_SITE_OTHER): Payer: Self-pay | Admitting: General Surgery

## 2012-03-19 VITALS — BP 140/98 | HR 74 | Temp 97.8°F | Resp 16 | Ht 62.5 in | Wt 229.4 lb

## 2012-03-19 DIAGNOSIS — Z9884 Bariatric surgery status: Secondary | ICD-10-CM

## 2012-03-19 NOTE — Patient Instructions (Signed)
Resume full activities Resume blood pressure medications

## 2012-03-19 NOTE — Progress Notes (Signed)
Subjective:     Patient ID: [name] [name], female   DOB: 09-12-1950, 61 y.o.   MRN: [id number]  HPI 61 year old Caucasian female comes in today for her first postoperative appointment after undergoing laparoscopic Roux-en-Y gastric bypass on October 29. She was discharged from the hospital on October 31. She states that she has been doing well since discharge. She denies any fever, chills, nausea, vomiting, regurgitation, melena, hematochezia, reflux. She is still taking her protein shakes. She has had some loose stools. She states that she saw her primary care physician yesterday. She is curious whether or not she should resume her blood pressure medication now. She is scheduled to see the nutritionist next week. She is no longer taking any narcotics since yesterday.  Review of Systems     Objective:   Physical Exam BP 140/98  Pulse 74  Temp 97.8 F (36.6 C) (Temporal)  Resp 16  Ht 5' 2.5" (1.588 m)  Wt 229 lb 6 oz (104.044 kg)  BMI 41.28 kg/m2  Gen: alert, NAD, non-toxic appearing Pupils: equal, no scleral icterus Pulm: Lungs clear to auscultation, symmetric chest rise CV: regular rate and rhythm Abd: soft, nontender, nondistended. Well-healed trocar sites. No cellulitis. No incisional hernia. Some mild staple erythema at epigastric trocar site Ext: no edema, no calf tenderness Skin: no rash, no jaundice     Assessment:     Status post laparoscopic Roux-en-Y gastric bypass    Plan:     Overall I think she is doing well. Her preoperative weight was 242 pounds. Today's weight is 229.6 pounds. We removed her surgical skin staples and placed Steri-Strips. I advised her she should resume but her blood pressure medications. I had held them on discharge because she had had some transient hypotension with systolics in the 90s. She is to continue with the high protein liquid diet for now. I will let the nutritionist advance her to the next phase next week. She is released to full  activities. She is scheduled to travel to the [place] next weekend. I advised her she should be walking around during layovers and flexing her calf muscles while in flight. Followup with me in one month  [name] Hastings M. Andrey Campanile, MD, FACS General, Bariatric, & Minimally Invasive Surgery [facility] Surgery, [place]

## 2012-03-24 ENCOUNTER — Encounter: Payer: Self-pay | Admitting: *Deleted

## 2012-03-24 ENCOUNTER — Encounter: Payer: BC Managed Care – PPO | Attending: General Surgery | Admitting: *Deleted

## 2012-03-24 VITALS — Ht 62.5 in | Wt 223.0 lb

## 2012-03-24 DIAGNOSIS — E669 Obesity, unspecified: Secondary | ICD-10-CM | POA: Insufficient documentation

## 2012-03-24 DIAGNOSIS — Z713 Dietary counseling and surveillance: Secondary | ICD-10-CM | POA: Insufficient documentation

## 2012-03-24 DIAGNOSIS — Z01818 Encounter for other preprocedural examination: Secondary | ICD-10-CM | POA: Insufficient documentation

## 2012-03-24 NOTE — Progress Notes (Signed)
Bariatric Class:  Appt start time: 1600 end time:  1700.  2 Week Post-Operative Nutrition Class  Patient was seen on 03/24/2012 for Post-[facility]ative Nutrition [facility].   Surgery date: 03/09/12 Surgery type: RYGB Start weight at [facility]: 246.5 lb  Weight today: 223.0 lb Weight change: 22.0 lb Total weight lost: 23.5 lb BMI: 40.1  TANITA  BODY COMP RESULTS  01/15/12 03/24/12   Fat Mass (lbs) 129.0 118.0   Fat Free Mass (lbs) 117.5 105.0   Total Body Water (lbs) 86.0 77.0   The following the learning objectives were met by the patient during this course:  Identifies Phase 3A (Soft, High Proteins) Dietary Goals and will begin from 2 weeks post-operatively to 2 months post-operatively  Identifies appropriate sources of fluids and proteins   States protein recommendations and appropriate sources post-operatively  Identifies the need for appropriate texture modifications, mastication, and bite sizes when consuming solids  Identifies appropriate multivitamin and calcium sources post-operatively  Describes the need for physical activity post-operatively and will follow MD recommendations  States when to call healthcare provider regarding medication questions or post-operative complications  Handouts given during class include:  Phase 3A: Soft, High Protein Diet Handout  Follow-Up Plan: Patient will follow-up at [facility] in 6 weeks for 2 months post-op nutrition visit for diet advancement per MD.

## 2012-03-24 NOTE — Patient Instructions (Signed)
Patient to follow Phase 3A-Soft, High Protein Diet and follow-up at [facility] in 6 weeks for 2 months post-op nutrition visit for diet advancement. 

## 2012-04-21 ENCOUNTER — Encounter (INDEPENDENT_AMBULATORY_CARE_PROVIDER_SITE_OTHER): Payer: Self-pay | Admitting: General Surgery

## 2012-04-21 ENCOUNTER — Ambulatory Visit (HOSPITAL_COMMUNITY)
Admission: RE | Admit: 2012-04-21 | Discharge: 2012-04-21 | Disposition: A | Discharge status: Home / Self Care | Payer: BC Managed Care – PPO | Source: Ambulatory Visit | Attending: General Surgery | Admitting: General Surgery

## 2012-04-21 ENCOUNTER — Ambulatory Visit (INDEPENDENT_AMBULATORY_CARE_PROVIDER_SITE_OTHER): Payer: BC Managed Care – PPO | Admitting: General Surgery

## 2012-04-21 VITALS — BP 120/80 | HR 72 | Resp 16 | Ht 62.5 in | Wt 217.0 lb

## 2012-04-21 DIAGNOSIS — Z09 Encounter for follow-up examination after completed treatment for conditions other than malignant neoplasm: Secondary | ICD-10-CM

## 2012-04-21 DIAGNOSIS — Z1231 Encounter for screening mammogram for malignant neoplasm of breast: Secondary | ICD-10-CM | POA: Insufficient documentation

## 2012-04-21 NOTE — Patient Instructions (Signed)
Try interval training like we discussed Use miralax as directed

## 2012-04-21 NOTE — Progress Notes (Signed)
Subjective:     Patient ID: [place] S Motz, female   DOB: 1950/11/08, 61 y.o.   MRN: [id number]  HPI 61 year old Caucasian female comes in for followup after undergoing laparoscopic Roux-en-Y gastric bypass on October 29. I last saw her in the office on November 8. She states overall she has been doing well. She denies any fever or chills. She has had some regurgitation mainly when eating solid meats. She has no trouble with liquids. She also has no trouble tolerating fish. She states that she is able to keep meats down as long as they are cooked in a soup. She states that she has had trouble with chicken and ham. She also reports some straining with bowel movements. She is having a bowel movement every day but reports having to strain more. She denies any melena or hematochezia. She states that her energy has improved. She is walking about twice a week for 30 minutes at a time at the same intensity. She is taking her supplements  Review of Systems     Objective:   Physical Exam BP 120/80  Pulse 72  Resp 16  Ht 5' 2.5" (1.588 m)  Wt 217 lb (98.431 kg)  BMI 39.06 kg/m2  Total wt loss since initial visit - 29 pounds Wt loss since surgery - 24 pounds  Gen: alert, NAD, non-toxic appearing Pupils: equal, no scleral icterus Pulm: Lungs clear to auscultation, symmetric chest rise CV: regular rate and rhythm Abd: soft, nontender, nondistended. Well-healed trocar sites. No cellulitis. No incisional hernia Ext: no edema, no calf tenderness Skin: no rash, no jaundice     Assessment:     S/p LRYGB HTN - improved Dislipidemia - stable     Plan:     I congratulated her on her weight loss. To date she has lost approximately 29 pounds. With respect to her difficulty with chicken and ham, I encouraged her to make sure that her bites are no larger than the size of a nickel. Also reminded her that she should chew very thoroughly and probably wait at least 30 seconds between bites. I told her  that she may have a small sip of liquid between bites if needed. We discussed increasing her exercise. We discussed setting small goals such as increasing number of days per week she is walking, increasing the incline level, and or increasing the intensity for short period of time, i.e. Interval training.  I told her she could try MiraLAX for her stools. She was encouraged to continue with her supplements. I also encouraged her to attend monthly support group where she may find recipes suggestions from other gastric bypass patients.  Followup 3 months  [name]. Andrey Campanile, MD, FACS General, Bariatric, & Minimally Invasive Surgery [facility] Surgery, [place]

## 2012-04-30 ENCOUNTER — Encounter: Payer: Self-pay | Admitting: *Deleted

## 2012-04-30 ENCOUNTER — Encounter: Payer: BC Managed Care – PPO | Attending: General Surgery | Admitting: *Deleted

## 2012-04-30 VITALS — Ht 62.5 in | Wt 214.0 lb

## 2012-04-30 DIAGNOSIS — E669 Obesity, unspecified: Secondary | ICD-10-CM | POA: Insufficient documentation

## 2012-04-30 DIAGNOSIS — Z01818 Encounter for other preprocedural examination: Secondary | ICD-10-CM | POA: Insufficient documentation

## 2012-04-30 DIAGNOSIS — Z713 Dietary counseling and surveillance: Secondary | ICD-10-CM | POA: Insufficient documentation

## 2012-04-30 NOTE — Progress Notes (Addendum)
Follow-up visit:  8 Weeks Post-Operative RYGB Surgery  Medical Nutrition Therapy:  Appt start time: 0930  End time: 1015.  Primary concerns today: Post-operative Bariatric Surgery Nutrition Management. Brought food packages to inquire if they were ok. Very on top of her nutrient intake and desires to "do this right". Doing a great job with foods, though excessive sodium intake noted. Discussed decreasing soups or making with her own with low-sodium or sodium-free broth (ex Herb-ox brand) instead of canned. Otherwise, needs to continue to work on slowing down when eating. Doing very well overall. Husband continues to be a great supporter and very encouraging to [place].   Surgery date: 03/09/12 Surgery type: RYGB Start weight at [facility]: 246.5 lb  Weight today: 214.0 lbs Weight change: 9.0 lbs Total weight lost: 32.5 lbs  TANITA  BODY COMP RESULTS  01/15/12 03/24/12 04/30/12    BMI (kg/m^2) 44.4 40.1 38.5   Fat Mass (lbs) 129.0 118.0 107.0   Fat Free Mass (lbs) 117.5 105.0 107.0   Total Body Water (lbs) 86.0 77.0 78.5   24-hr recall: B (AM): Premier protein shake - 30g  Snk (AM): None  L (PM):  3/4 cup Campbell's veg beef soup - 5g (890 mg sodium!!)  Snk (PM): Roasted edamame (1/4 c) - 15g  D (PM): 2-3 oz grilled shrimp, green beans - 15-20g Snk (PM): None  Fluid intake: Water (26oz x2), 11oz shake, 6 oz soup = 65-70 oz Estimated total protein intake: 65-70g  Medications: No changes reported Supplementation: Taking regularly  Using straws: No Drinking while eating: No Hair loss: No Carbonated beverages: No N/V/D/C: Some regurgitation/food "hanging" when eating too fast Dumping syndrome: None reported  Recent physical activity:  Walks several times a week  Progress Towards Goal(s):  In progress.  Handouts given during visit include:  Phase 3B: High Protein + Non-Starchy Vegetables  Samples given during visit include:   Freedavite Multivitamin: 7 bottles (5  tabs/bottle) Lot # [id number] Exp: 01/17   Nutritional Diagnosis:  [place]-3.3 Overweight/obesity related to h/o poor dietary habits and physical inactivity as evidenced by patient w/ recent RYGB surgery following post-operative dietary guidelines for continued weight loss.    Intervention:  Nutrition education/diet advancement.  Monitoring/Evaluation:  Dietary intake, exercise, and body weight. Follow up in 1 months for 3 month post-op visit.

## 2012-04-30 NOTE — Patient Instructions (Addendum)
Goals:  Follow Phase 3B: High Protein + Non-Starchy Vegetables  Eat 3-6 small meals/snacks, every 3-5 hrs  Increase lean protein foods to meet 60-80g goal  Increase fluid intake to 64oz +  Avoid drinking 15 minutes before, during and 30 minutes after eating  Aim for >30 min of physical activity daily 

## 2012-06-04 ENCOUNTER — Ambulatory Visit: Payer: BC Managed Care – PPO | Admitting: *Deleted

## 2012-06-11 ENCOUNTER — Encounter: Payer: Self-pay | Admitting: *Deleted

## 2012-06-11 ENCOUNTER — Encounter: Payer: BC Managed Care – PPO | Attending: General Surgery | Admitting: *Deleted

## 2012-06-11 VITALS — Ht 62.5 in | Wt 201.0 lb

## 2012-06-11 DIAGNOSIS — E669 Obesity, unspecified: Secondary | ICD-10-CM | POA: Insufficient documentation

## 2012-06-11 DIAGNOSIS — Z01818 Encounter for other preprocedural examination: Secondary | ICD-10-CM | POA: Insufficient documentation

## 2012-06-11 DIAGNOSIS — Z713 Dietary counseling and surveillance: Secondary | ICD-10-CM | POA: Insufficient documentation

## 2012-06-11 NOTE — Progress Notes (Signed)
Follow-up visit:  12 Weeks Post-Operative RYGB Surgery  Medical Nutrition Therapy:  Appt start time: 1000  End time: 1045.  Primary concerns today: Post-operative Bariatric Surgery Nutrition Management.  Surgery date: 03/09/12 Surgery type: RYGB Start weight at [facility]: 246.5 lb  Weight today: 201.0 lbs Weight change: 13.0 lbs Total weight lost: 45.5 lbs  TANITA  BODY COMP RESULTS  01/15/12 03/24/12 04/30/12 06/11/12    BMI (kg/m^2) 44.4 40.1 38.5 36.2   Fat Mass (lbs) 129.0 118.0 107.0 97.5   Fat Free Mass (lbs) 117.5 105.0 107.0 103.5   Total Body Water (lbs) 86.0 77.0 78.5 75.5   24-hr recall: B (AM): Premier protein shake - 30g  Snk (AM): None  L (PM):  3 oz fish  (15-20g)   Snk (PM): Roasted edamame (1/4 c) - (15g) or NONE D (PM):  3 oz grilled shrimp, green beans (15-20g) Snk (PM): None OR SF popsicle  Fluid intake: Coffee decaf 10 oz, water (26 oz x3), 11-20 oz shake, 6 oz soup = 80-90 oz Estimated total protein intake: 65-70g  Medications: No changes reported Supplementation: Taking regularly  Using straws: No Drinking while eating: No Hair loss: No Carbonated beverages: No N/V/D/C:  Not tolerating meat at all. Has been eating mainly seafood and protein shakes.  Dumping syndrome: None reported  Recent physical activity:  Biking 6 days/week, [place] Plans to do treadmill and yoga tape while at home.   Progress Towards Goal(s):  In progress.  Samples given during visit include:   Unjury Protein Powder Lot # [id number]; 02/15 - 3 pkts Lot # [id number]; 03/15 - 1 pkts   Nutritional Diagnosis:  [place]-3.3 Overweight/obesity related to h/o poor dietary habits and physical inactivity as evidenced by patient w/ recent RYGB surgery following post-operative dietary guidelines for continued weight loss.    Intervention:  Nutrition education/diet advancement.  Monitoring/Evaluation:  Dietary intake, exercise, and body weight. Follow up in 6 weeks for 4-5  month post-op visit.

## 2012-06-11 NOTE — Patient Instructions (Addendum)
Goals:  Follow Phase 3B: High Protein + Non-Starchy Vegetables  Eat 3-6 small meals/snacks, every 3-5 hrs  Increase lean protein foods to meet 60-80g goal  Increase fluid intake to 64oz +  Avoid drinking 15 minutes before, during and 30 minutes after eating  Aim for >30 min of physical activity daily 

## 2012-07-29 ENCOUNTER — Encounter: Payer: Self-pay | Admitting: *Deleted

## 2012-07-29 ENCOUNTER — Ambulatory Visit (INDEPENDENT_AMBULATORY_CARE_PROVIDER_SITE_OTHER): Payer: BC Managed Care – PPO | Admitting: General Surgery

## 2012-07-29 ENCOUNTER — Encounter (INDEPENDENT_AMBULATORY_CARE_PROVIDER_SITE_OTHER): Payer: Self-pay | Admitting: General Surgery

## 2012-07-29 ENCOUNTER — Encounter: Payer: BC Managed Care – PPO | Attending: General Surgery | Admitting: *Deleted

## 2012-07-29 VITALS — BP 112/90 | HR 60 | Temp 97.6°F | Resp 20 | Ht 62.0 in | Wt 185.4 lb

## 2012-07-29 VITALS — Ht 62.5 in | Wt 186.0 lb

## 2012-07-29 DIAGNOSIS — Z713 Dietary counseling and surveillance: Secondary | ICD-10-CM | POA: Insufficient documentation

## 2012-07-29 DIAGNOSIS — Z09 Encounter for follow-up examination after completed treatment for conditions other than malignant neoplasm: Secondary | ICD-10-CM

## 2012-07-29 DIAGNOSIS — Z01818 Encounter for other preprocedural examination: Secondary | ICD-10-CM | POA: Insufficient documentation

## 2012-07-29 DIAGNOSIS — Z9884 Bariatric surgery status: Secondary | ICD-10-CM

## 2012-07-29 DIAGNOSIS — E669 Obesity, unspecified: Secondary | ICD-10-CM | POA: Insufficient documentation

## 2012-07-29 NOTE — Progress Notes (Signed)
Follow-up visit:  4.5 Months Post-Operative RYGB Surgery  Medical Nutrition Therapy:  Appt start time: 1000  End time: 1045.  Primary concerns today: Post-operative Bariatric Surgery Nutrition Management.  Surgery date: 03/09/12 Surgery type: RYGB Start weight at [facility]: 246.5 lb  Weight today: 186.0 lbs Weight change: 15.0 lbs Total weight lost: 60.5 lbs  Goal weight: 140-150 lbs % goal met:  57-63%  TANITA  BODY COMP RESULTS  01/15/12 03/24/12 04/30/12 06/11/12 07/29/12    BMI (kg/m^2) 44.4 40.1 38.5 36.2 33.5   Fat Mass (lbs) 129.0 118.0 107.0 97.5 82.0   Fat Free Mass (lbs) 117.5 105.0 107.0 103.5 104.0   Total Body Water (lbs) 86.0 77.0 78.5 75.5 76.0   Fluid intake: Coffee decaf 10 oz, water (26 oz x3), 11-20 oz shake, 6 oz soup = 80-90 oz Estimated total protein intake: 65-70g  Medications: No longer on cholesterol medication (Lipitor) Supplementation: Taking regularly  Using straws: No Drinking while eating: No Hair loss: No Carbonated beverages: No N/V/D/C:  Continues having hard time tolerating red meat; chicken and fish are ok.  Dumping syndrome: None reported  Recent physical activity:  Biking 6 days/week, [place] and has added Pilates.    Progress Towards Goal(s):  In progress.   Nutritional Diagnosis:  [place]-3.3 Overweight/obesity related to h/o poor dietary habits and physical inactivity as evidenced by patient w/ recent RYGB surgery following post-operative dietary guidelines for continued weight loss.    Intervention:  Nutrition education/diet advancement.  Monitoring/Evaluation:  Dietary intake, exercise, and body weight. Follow up in 3 months for post-op visit.

## 2012-07-29 NOTE — Patient Instructions (Signed)
Keep up the great work! Continue taking supplements as directed

## 2012-07-29 NOTE — Patient Instructions (Addendum)
Goals:  Follow Phase 3B: High Protein + Non-Starchy Vegetables  Increase lean protein foods to meet 60-80g goal  Increase fluid intake to 64oz +  Add 15 grams of carbohydrate (fruit, whole grains) with meals  Start by adding to 1 meal a day  If weight loss doesn't slow, may add to another meal or have as a snack with protein  Aim for no more than 75 grams of carbs daily

## 2012-07-29 NOTE — Progress Notes (Signed)
Subjective:     Patient ID: [name] [name], female   DOB: Sep 15, 1950, 62 y.o.   MRN: [id number]  HPI 62 year old Caucasian female comes in for five-month followup after undergoing laparoscopic Roux-en-Y gastric bypass on 03/09/2012. I last saw in the office on 04/21/2012. She states that she has been doing great. She has been spending most of her time recently in the [place]. She denies any nausea or vomiting. She has had a couple episodes of regurgitation when trying to eat meats such as chicken. She is concerned that she may not be taking smaller bites. She is having daily bowel movements. She states that she feels more energetic. She states that she is exercising more. She is bicycling and swimming and doing yoga. She states that she no longer has to take naps in the afternoon. She no longer has any foot pain. She no longer has to wear any orthotics. She is no longer taking her Lipitor. She did have one episode of lightheadedness and dizziness a few weeks ago. She is taking her supplements as directed. She denies any hair thinning or paresthesias.  PMHx, PSHx, SOCHx, FAMHx, ALL reviewed   Review of Systems 10 point review of systems was performed and all systems are negative except for what is mentioned in the history of present illness    Objective:   Physical Exam  Vitals reviewed. Constitutional: She is oriented to person, place, and time. She appears well-developed and well-nourished. No distress.  HENT:  Head: Normocephalic and atraumatic.  Right Ear: External ear normal.  Left Ear: External ear normal.  Eyes: Conjunctivae are normal. No scleral icterus.  Neck: Neck supple.  Cardiovascular: Normal rate, regular rhythm and normal heart sounds.   Pulmonary/Chest: Effort normal and breath sounds normal. No respiratory distress. She has no wheezes.  Abdominal: Soft. She exhibits no distension. There is no tenderness. There is no rebound.  Well healed trocar sites   Musculoskeletal: She exhibits no edema and no tenderness.  Lymphadenopathy:    She has no cervical adenopathy.  Neurological: She is alert and oriented to person, place, and time.  Skin: Skin is warm and dry. No rash noted. She is not diaphoretic. No erythema.  tann       Assessment:     Status post laparoscopic Roux-en-Y gastric bypass 03/09/2012 Hypertension-improved Hyperlipidemia-resolved Osteoarthritis-improved     Plan:     Overall I'm very pleased with how she is doing. Her total weight loss to date is approximately 56 pounds. Her weight loss since her last office visit has been 32 pounds.  With respect to difficulty with the meats, I explained that this is not uncommon. I advised her to try using infant or toddler-sized utensils to reinforce the concept of taking small bites. I encouraged her to continue with her daily supplements as directed. Encouraged her to continue with routine exercise.  She is due for lab surveillance. We will get our routine surveillance labs.   Followup 3 months  [name]. Andrey Campanile, MD, FACS General, Bariatric, & Minimally Invasive Surgery [facility] Surgery, [place]

## 2012-07-30 LAB — CBC WITH DIFFERENTIAL/PLATELET
Basophils Absolute: 0 10*3/uL (ref 0.0–0.1)
Eosinophils Relative: 4 % (ref 0–5)
HCT: 37.7 % (ref 36.0–46.0)
Lymphocytes Relative: 33 % (ref 12–46)
Lymphs Abs: 1.9 10*3/uL (ref 0.7–4.0)
MCV: 84.9 fL (ref 78.0–100.0)
Monocytes Absolute: 0.4 10*3/uL (ref 0.1–1.0)
Neutro Abs: 3.3 10*3/uL (ref 1.7–7.7)
RBC: 4.44 MIL/uL (ref 3.87–5.11)
RDW: 15.5 % (ref 11.5–15.5)
WBC: 6 10*3/uL (ref 4.0–10.5)

## 2012-07-30 LAB — LIPID PANEL
LDL Cholesterol: 171 mg/dL — ABNORMAL HIGH (ref 0–99)
Triglycerides: 87 mg/dL (ref <150)
VLDL: 17 mg/dL (ref 0–40)

## 2012-07-30 LAB — FOLATE: Folate: 11.5 ng/mL

## 2012-07-30 LAB — COMPREHENSIVE METABOLIC PANEL
ALT: 27 U/L (ref 0–35)
AST: 20 U/L (ref 0–37)
Albumin: 4 g/dL (ref 3.5–5.2)
BUN: 22 mg/dL (ref 6–23)
CO2: 29 mEq/L (ref 19–32)
Calcium: 9.4 mg/dL (ref 8.4–10.5)
Chloride: 101 mEq/L (ref 96–112)
Potassium: 4 mEq/L (ref 3.5–5.3)

## 2012-07-31 LAB — VITAMIN D 25 HYDROXY (VIT D DEFICIENCY, FRACTURES): Vit D, 25-Hydroxy: 44 ng/mL (ref 30–89)

## 2012-08-02 ENCOUNTER — Telehealth (INDEPENDENT_AMBULATORY_CARE_PROVIDER_SITE_OTHER): Payer: Self-pay | Admitting: General Surgery

## 2012-08-02 NOTE — Telephone Encounter (Signed)
Left message on machine for patient to call back and ask for me. To let her know that labs were good except lipids and see if she was fasting for labs.

## 2012-08-02 NOTE — Telephone Encounter (Signed)
Message copied by Liliana Cline on Mon Aug 02, 2012 10:23 AM ------      Message from: Andrey Campanile, ERIC M      Created: Mon Aug 02, 2012  9:45 AM       pls call pt with results - labs ok except lipids still elevated. Was she fasting? i forwarded results to Dr Collins Scotland ------

## 2012-09-17 ENCOUNTER — Encounter: Payer: BC Managed Care – PPO | Attending: General Surgery | Admitting: *Deleted

## 2012-09-17 VITALS — Ht 62.5 in | Wt 177.0 lb

## 2012-09-17 DIAGNOSIS — Z01818 Encounter for other preprocedural examination: Secondary | ICD-10-CM | POA: Insufficient documentation

## 2012-09-17 DIAGNOSIS — Z713 Dietary counseling and surveillance: Secondary | ICD-10-CM | POA: Insufficient documentation

## 2012-09-17 DIAGNOSIS — E669 Obesity, unspecified: Secondary | ICD-10-CM | POA: Insufficient documentation

## 2012-09-17 NOTE — Progress Notes (Signed)
Follow-up visit:  8 Months Post-Operative RYGB Surgery  Medical Nutrition Therapy:  Appt start time: 1000  End time: 1030.  Primary concerns today: Post-operative Bariatric Surgery Nutrition Management. Doing very well, though reports episodes of dizziness. Has appt with PCP on Monday. Advised to discuss with Dr. Collins Scotland. Does not eat any breads or sugar. Continues to have problems with red, itchy rash underneath right breast. Gets worse when down in [place].    Surgery date: 03/09/12 Surgery type: RYGB Start weight at [facility]: 246.5 lbs (01/15/12)  Weight today: 177.0 lbs Weight change: 9.0 lbs Total weight lost: 69.5 lbs  Goal weight: 140-150 lbs % goal met:  65-72%  TANITA  BODY COMP RESULTS  01/15/12 03/24/12 04/30/12 06/11/12 07/29/12 09/17/12    BMI (kg/m^2) 44.4 40.1 38.5 36.2 33.5 31.9   Fat Mass (lbs) 129.0 118.0 107.0 97.5 82.0 75.5   Fat Free Mass (lbs) 117.5 105.0 107.0 103.5 104.0 101.5   Total Body Water (lbs) 86.0 77.0 78.5 75.5 76.0 74.5   Fluid intake: Coffee decaf 10 oz, water (26 oz x3), 11-20 oz shake, 6 oz soup = 80-90 oz Estimated total protein intake: 60-70g  Medications: No changes Supplementation: Taking regularly  Using straws: No Drinking while eating: No Hair loss: No Carbonated beverages: No N/V/D/C:  Reports pain when eating too fast.   Dumping syndrome: None reported  Recent physical activity:  Decreased lately d/t increased traveling.  Still swims almost 3-4 days/week, some diving (when in [place]). Plans to resume walking asap.     Progress Towards Goal(s):  In progress.   Nutritional Diagnosis:  [place]-3.3 Overweight/obesity related to h/o poor dietary habits and physical inactivity as evidenced by patient w/ recent RYGB surgery following post-operative dietary guidelines for continued weight loss.    Intervention:  Nutrition education/diet advancement.  Monitoring/Evaluation:  Dietary intake, exercise, and body weight. Follow up in 3-6 months for 9-12  post-op visit.

## 2012-09-17 NOTE — Patient Instructions (Addendum)
Goals:  Follow Phase 3B: High Protein + Non-Starchy Vegetables  Increase lean protein foods to meet 60-80g goal  Increase fluid intake to 64oz +  Add 15 grams of carbohydrate (fruit, whole grains) with meals  Start by adding to 1 meal a day  If weight loss doesn't slow, may add to another meal or have as a snack with protein  Aim for no more than 75 grams of carbs daily  Try PB2 and P3 snacks

## 2012-11-04 ENCOUNTER — Encounter (INDEPENDENT_AMBULATORY_CARE_PROVIDER_SITE_OTHER): Payer: Self-pay | Admitting: General Surgery

## 2012-11-04 ENCOUNTER — Ambulatory Visit (INDEPENDENT_AMBULATORY_CARE_PROVIDER_SITE_OTHER): Payer: BC Managed Care – PPO | Admitting: General Surgery

## 2012-11-04 ENCOUNTER — Telehealth (INDEPENDENT_AMBULATORY_CARE_PROVIDER_SITE_OTHER): Payer: Self-pay | Admitting: General Surgery

## 2012-11-04 ENCOUNTER — Ambulatory Visit: Payer: BC Managed Care – PPO | Admitting: *Deleted

## 2012-11-04 VITALS — BP 120/72 | HR 64 | Temp 97.2°F | Resp 14 | Ht 62.0 in | Wt 168.6 lb

## 2012-11-04 DIAGNOSIS — Z9884 Bariatric surgery status: Secondary | ICD-10-CM

## 2012-11-04 NOTE — Progress Notes (Signed)
Subjective:     Patient ID: [name] [name], female   DOB: 1950-09-13, 62 y.o.   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 I last saw her in the office on March 20. At that time her weight was 185 pounds. Her highest weight we recorded was 245 pounds. She states that she has been doing quite well. She is averaging 10,000-15,000 steps per day using her pedometer. She is also swimming, bicycling, and doing yoga. She states occasionally she still has issues with chewing thoroughly and taking time to eat. She feels sometimes she eats too rapidly. She states that she is making good food choices. She denies any nausea, vomiting, reflux, hair loss, paresthesias, abdominal pain or bloating. She has 1-2 bowel movements a day. She is only using a laxative twice a month. She has had some issues over the past month or so of feeling a little lightheaded when she gets up from a seated position. It only last a second or 2. Her primary care physician has decreased the dosage of her blood pressure medication. She states that this has helped some but is completely not gone away.   PMHx, PSHx, SOCHx, FAMHx, ALL reviewed   Review of Systems 12 point ROS performed and negative except for HPI    Objective:   Physical Exam BP 120/72  Pulse 64  Temp(Src) 97.2 F (36.2 C) (Temporal)  Resp 14  Ht 5\' 2"  (1.575 m)  Wt 168 lb 9.6 oz (76.476 kg)  BMI 30.83 kg/m2  Gen: alert, NAD, non-toxic appearing Pupils: equal, no scleral icterus Pulm: Lungs clear to auscultation, symmetric chest rise CV: regular rate and rhythm Abd: soft, nontender, nondistended. Well-healed trocar sites. No cellulitis. No incisional hernia Ext: no edema, no calf tenderness Skin: no rash, no jaundice     Assessment:     S/p LRYGB HTN - improved HPL - improved (decreased dosage by half) OA - improved     Plan:     I congratulated her on  her weight loss. Her weight loss to date has been approximately 76 pounds. I encouraged her to keep up with all the activities she is doing. With respect to eating she rapidly we discussed using small infant utensils, using her nondominant hand to eat with, and using the timer on her smartphone to time between taking bites of food. She states that Dr. Collins [name] checked her insulin level and it was a little bit elevated. I do not have those records. We'll request the labs and office notes from Dr. Alda [name] office and I'll review them.  Otherwise followup in 3 months. She is doing great  [name] [name]. [name] Campanile, MD, FACS General, Bariatric, & Minimally Invasive Surgery [facility], [place]

## 2012-11-04 NOTE — Telephone Encounter (Signed)
Called and spoke with [name] [name] Dr.Spear's office to req per verbal orders from EW last couple office notes along with labs...they are faxing prior labs before the ones that were drawn today and office notes and [name] [name] stated she would make note to fax the labs that were drawn this morning whenever the come back as well..EW notified

## 2012-11-04 NOTE — Patient Instructions (Signed)
Remember to use small utensils when eating Use her nondominant hand to eat with Do not watch TV while eating Use the timer on your smartphone to time between bites of food 20-30 secs

## 2012-11-19 ENCOUNTER — Telehealth (INDEPENDENT_AMBULATORY_CARE_PROVIDER_SITE_OTHER): Payer: Self-pay | Admitting: General Surgery

## 2012-11-19 ENCOUNTER — Other Ambulatory Visit: Payer: Self-pay | Admitting: Dermatology

## 2012-11-19 NOTE — Telephone Encounter (Signed)
Called patient per verbal orders by EW that labs were fine and even though we were missing a few we would just draw those at her yearly appt per EW..patient verbalized agreement and was very pleased

## 2012-11-29 ENCOUNTER — Encounter: Payer: Self-pay | Admitting: *Deleted

## 2012-11-29 ENCOUNTER — Encounter: Payer: BC Managed Care – PPO | Attending: General Surgery | Admitting: *Deleted

## 2012-11-29 VITALS — Ht 62.5 in | Wt 165.0 lb

## 2012-11-29 DIAGNOSIS — Z01818 Encounter for other preprocedural examination: Secondary | ICD-10-CM | POA: Insufficient documentation

## 2012-11-29 DIAGNOSIS — E669 Obesity, unspecified: Secondary | ICD-10-CM

## 2012-11-29 DIAGNOSIS — Z713 Dietary counseling and surveillance: Secondary | ICD-10-CM | POA: Insufficient documentation

## 2012-11-29 NOTE — Patient Instructions (Addendum)
Goals:  Follow Phase 3B: High Protein + Non-Starchy Vegetables  Increase lean protein foods to meet 60-80g goal  Increase fluid intake to 64oz +  Add 15 grams of carbohydrate (fruit, whole grains/toast) with meals  Start by adding to 1 meal a day  If weight loss doesn't slow, may add to another meal or have as a snack with protein  Aim for no more than 75 grams of carbs daily - 15 g of carbs per meal or snack    TANITA  BODY COMP RESULTS  01/15/12 03/24/12 04/30/12 06/11/12 07/29/12 09/17/12 11/29/12    BMI (kg/m^2) 44.4 40.1 38.5 36.2 33.5 31.9 29.7   Fat Mass (lbs) 129.0 118.0 107.0 97.5 82.0 75.5 61.0   Fat Free Mass (lbs) 117.5 105.0 107.0 103.5 104.0 101.5 104.0   Total Body Water (lbs) 86.0 77.0 78.5 75.5 76.0 74.5 76.0

## 2012-11-29 NOTE — Progress Notes (Addendum)
Follow-up visit:  9 Months Post-Operative RYGB Surgery  Medical Nutrition Therapy:  Appt start time:  430  End time:  500.  Primary concerns today: Post-operative Bariatric Surgery Nutrition Management. Doing very well with no problems. Reports liver enzymes are elevated.   Surgery date: 03/09/12 Surgery type: RYGB Start weight at [facility]: 246.5 lbs (01/15/12)  Weight today: 165.0 lbs Weight change: 12.0 lbs Total weight lost: 81.5 lbs  Goal weight: 140-150 lbs % goal met:  76-84%  TANITA  BODY COMP RESULTS  01/15/12 03/24/12 04/30/12 06/11/12 07/29/12 09/17/12 11/29/12    BMI (kg/m^2) 44.4 40.1 38.5 36.2 33.5 31.9 29.7   Fat Mass (lbs) 129.0 118.0 107.0 97.5 82.0 75.5 61.0   Fat Free Mass (lbs) 117.5 105.0 107.0 103.5 104.0 101.5 104.0   Total Body Water (lbs) 86.0 77.0 78.5 75.5 76.0 74.5 76.0   Fluid intake: Coffee decaf 10 oz, water (26 oz x3), 11-20 oz shake, 6 oz soup = 80-90 oz Estimated total protein intake: 60-70g  Medications: No changes Supplementation: Taking regularly.  Discussed Nascobal and gave fax order form  Using straws: No Drinking while eating: No Hair loss: No Carbonated beverages: No N/V/D/C:  None Dumping syndrome: None reported  Recent physical activity:  Plays tennis 2x/week @ 60 min each; other days at the gym and swimming.   Progress Towards Goal(s):  In progress.   Nutritional Diagnosis:  [place]-3.3 Overweight/obesity related to h/o poor dietary habits and physical inactivity as evidenced by patient w/ recent RYGB surgery following post-operative dietary guidelines for continued weight loss.    Intervention:  Nutrition education/diet advancement.  Samples given during visit include:   Nascobal B12 Nasal Spray - 1 ea Lot: [id number]; Exp: 10/15  Monitoring/Evaluation:  Dietary intake, exercise, and body weight. Follow up in 3 months for 12 post-op visit.

## 2012-12-01 ENCOUNTER — Encounter (INDEPENDENT_AMBULATORY_CARE_PROVIDER_SITE_OTHER): Payer: Self-pay

## 2013-01-28 ENCOUNTER — Other Ambulatory Visit (INDEPENDENT_AMBULATORY_CARE_PROVIDER_SITE_OTHER): Payer: Self-pay | Admitting: General Surgery

## 2013-01-28 ENCOUNTER — Encounter: Payer: BC Managed Care – PPO | Attending: General Surgery | Admitting: *Deleted

## 2013-01-28 ENCOUNTER — Encounter (INDEPENDENT_AMBULATORY_CARE_PROVIDER_SITE_OTHER): Payer: Self-pay | Admitting: General Surgery

## 2013-01-28 ENCOUNTER — Ambulatory Visit (INDEPENDENT_AMBULATORY_CARE_PROVIDER_SITE_OTHER): Payer: BC Managed Care – PPO | Admitting: General Surgery

## 2013-01-28 ENCOUNTER — Encounter: Payer: Self-pay | Admitting: *Deleted

## 2013-01-28 VITALS — BP 116/62 | HR 60 | Resp 16 | Ht 63.5 in | Wt 153.8 lb

## 2013-01-28 VITALS — Ht 62.5 in | Wt 154.0 lb

## 2013-01-28 DIAGNOSIS — E663 Overweight: Secondary | ICD-10-CM

## 2013-01-28 DIAGNOSIS — Z9884 Bariatric surgery status: Secondary | ICD-10-CM

## 2013-01-28 DIAGNOSIS — E669 Obesity, unspecified: Secondary | ICD-10-CM | POA: Insufficient documentation

## 2013-01-28 DIAGNOSIS — Z713 Dietary counseling and surveillance: Secondary | ICD-10-CM | POA: Insufficient documentation

## 2013-01-28 DIAGNOSIS — Z01818 Encounter for other preprocedural examination: Secondary | ICD-10-CM | POA: Insufficient documentation

## 2013-01-28 LAB — IBC PANEL
%SAT: 28 % (ref 20–55)
TIBC: 303 ug/dL (ref 250–470)
UIBC: 217 ug/dL (ref 125–400)

## 2013-01-28 LAB — CBC WITH DIFFERENTIAL/PLATELET
Basophils Relative: 1 % (ref 0–1)
Eosinophils Absolute: 0.3 10*3/uL (ref 0.0–0.7)
Eosinophils Relative: 6 % — ABNORMAL HIGH (ref 0–5)
HCT: 37.1 % (ref 36.0–46.0)
Hemoglobin: 12.4 g/dL (ref 12.0–15.0)
MCH: 28.9 pg (ref 26.0–34.0)
MCHC: 33.4 g/dL (ref 30.0–36.0)
Monocytes Absolute: 0.3 10*3/uL (ref 0.1–1.0)
Monocytes Relative: 6 % (ref 3–12)
Neutrophils Relative %: 49 % (ref 43–77)

## 2013-01-28 LAB — VITAMIN D 25 HYDROXY (VIT D DEFICIENCY, FRACTURES): Vit D, 25-Hydroxy: 64 ng/mL (ref 30–89)

## 2013-01-28 NOTE — Patient Instructions (Addendum)
Goals:  Follow Phase 3B: High Protein + Non-Starchy Vegetables  Increase lean protein foods to meet 60-80g goal  Increase fluid intake to 64oz +  Add 15 grams of carbohydrate (fruit, whole grains/toast) with meals  Start by adding to 1 meal a day  If weight loss doesn't slow, may add to another meal or have as a snack with protein  Aim for no more than 75 grams of carbs daily - 15 g of carbs per meal or snack

## 2013-01-28 NOTE — Progress Notes (Signed)
Follow-up visit:  11 Months Post-Operative RYGB Surgery  Medical Nutrition Therapy:  Appt start time:  830  End time:  900.  Primary concerns today: Post-operative Bariatric Surgery Nutrition Management. Reports cutting back on fruit and crackers a bit and watching portion sizes. Has increased exercise as well. Reports "smelly gas" around 6 pm and is curious what she can take. May be increased fat at meal or too much fiber.  Reports liver enzymes are back WNL.   Surgery date: 03/09/12 Surgery type: RYGB Start weight at [facility]: 246.5 lbs (01/15/12)  Weight today: 154.0  lbs Weight change: 11.0 lbs Total weight lost:  92.5 lbs  Goal weight: 140-150 lbs  TANITA  BODY COMP RESULTS  01/15/12 03/24/12 04/30/12 06/11/12 07/29/12 09/17/12 11/29/12 01/28/13    BMI (kg/m^2) 44.4 40.1 38.5 36.2 33.5 31.9 29.7 27.7   Fat Mass (lbs) 129.0 118.0 107.0 97.5 82.0 75.5 61.0 56.0   Fat Free Mass (lbs) 117.5 105.0 107.0 103.5 104.0 101.5 104.0 98.0   Total Body Water (lbs) 86.0 77.0 78.5 75.5 76.0 74.5 76.0 71.5   Yogurt w/ 1/4 c Fiber One and few nuts - breakfast Cup of soup, protein shakes - lunches 3 oz meat, vegetable - dinner + 3 snacks  Fluid intake: Coffee decaf 10 oz, water (26 oz x3), 11-20 oz shake, 6 oz soup = 80-90 oz Estimated total protein intake: 60-70g  Medications: No changes Supplementation: Taking regularly.   Using straws: No Drinking while eating: No Hair loss: No Carbonated beverages: No N/V/D/C:  None Dumping syndrome: None reported  Recent physical activity:  Plays tennis 2x/week @ 60 min each; other days at the gym and swimming.  Increased from last visit.   Progress Towards Goal(s):  In progress.   Nutritional Diagnosis:  [place]-3.3 Overweight/obesity related to h/o poor dietary habits and physical inactivity as evidenced by patient w/ recent RYGB surgery following post-operative dietary guidelines for continued weight loss.    Intervention:  Nutrition education/diet  advancement.  Monitoring/Evaluation:  Dietary intake, exercise, and body weight. Follow up in 6 weeks.

## 2013-01-28 NOTE — Progress Notes (Signed)
Subjective:     Patient ID: [name] [name], female   DOB: 05/03/1951, 62 y.o.   MRN: [id number]  HPI  62 year old Caucasian female comes in for long-term followup after undergoing laparoscopic Roux-en-Y gastric bypass surgery in 03/09/2012. I last saw her in June of this year. She states that she's been doing great. She states that she has even added more activities to her exercise regimen. She is now doing some aerobic classes. She denies any nausea, vomiting, reflux, abdominal pain or constipation. She reports that she is more energetic. She denies any paresthesias. She states that she is taking her supplements for the most part 4 days out of 7 days. She states that she is sometimes difficulty remembering to take them. Her biggest complaint is that she has worsening flatulence generally in the evening around dinner time. She states that it generally occurs no matter what she is eating. She said it is particularly worse if she is eating out at a restaurant. She is taking some fiber one cereal twice a day. She denies any melena or hematochezia. She denies any dumping syndrome. The mild lightheadedness that she had complained of at her last visit has resolved.  PMHx, PSHx, SOCHx, FAMHx, ALL reviewed   I reviewed labs that she had done on September 9 which revealed a normal electrolyte panel. Normal LFTs. Total cholesterol 160, triglycerides 77, HDL 69, LDL 77  Review of Systems 12 point review of systems is performed and all systems are negative except for what is mentioned above    Objective:   Physical Exam BP 116/62  Pulse 60  Resp 16  Ht 5' 3.5" (1.613 m)  Wt 153 lb 12.8 oz (69.763 kg)  BMI 26.81 kg/m2  Gen: alert, NAD, non-toxic appearing Pupils: equal, no scleral icterus Pulm: Lungs clear to auscultation, symmetric chest rise CV: regular rate and rhythm Abd: soft, nontender, nondistended. Well-healed trocar sites. No cellulitis. 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 Her total weight loss since surgery has been approximately 92.5 pounds. I congratulated her on her weight loss. Her weight at her last office visit in June was 168.6 pounds. I encouraged her to keep up with her regular exercise. We also discussed the importance of taking her supplements on a daily basis. We also discussed strategies to help her with remembering to take her medications.She also inquired about plastic surgeons for body contouring surgery. I provided her the names of Dr. Kelly Splinter and Dr. Odis Luster. With respect to the flatulence I think is mainly due to a supplemental fiber she is taking the fiber one. I instructed her to stop taking supplemental fiber 1 products. I also encouraged her to keep it food log and track her symptoms.Although she has had some lab work she is still noticing some labs. She was given a lab slip to have a CBC, iron panel, vitamin D, folate, and vitamin B level checked. She was instructed to get the lab work done. Followup 1 year  [name]. Andrey Campanile, MD, FACS General, Bariatric, & Minimally Invasive Surgery [facility], [name]

## 2013-01-28 NOTE — Patient Instructions (Addendum)
Stop taking FiberOne Keep a journal of fiber intake and symptoms Take supplements daily - consider phone or calendar reminder Keep up the good work Dr Wayland Denis and Dr Etter Sjogren Continue with the regular exercise

## 2013-01-31 ENCOUNTER — Telehealth (INDEPENDENT_AMBULATORY_CARE_PROVIDER_SITE_OTHER): Payer: Self-pay | Admitting: General Surgery

## 2013-01-31 NOTE — Telephone Encounter (Signed)
LMOM 9/22@11 :48 for pt to call me back.. just to let pt know that labs came back and they are normal per EW-see below

## 2013-01-31 NOTE — Telephone Encounter (Signed)
Message copied by June Leap on Mon Jan 31, 2013 11:48 AM ------      Message from: Andrey Campanile, ERIC M      Created: Mon Jan 31, 2013  8:55 AM       pls call pt - labs are normal ------

## 2013-02-01 NOTE — Telephone Encounter (Signed)
LMOM@11 :52 for patient to return my call...plz see below

## 2013-02-03 NOTE — Telephone Encounter (Signed)
LMOM letting pt know that labs were normal and she could call me back should she have any questions 02/03/13@9 :14

## 2013-04-06 ENCOUNTER — Ambulatory Visit: Payer: BC Managed Care – PPO | Admitting: *Deleted

## 2013-04-13 ENCOUNTER — Other Ambulatory Visit: Payer: Self-pay | Admitting: Dermatology

## 2013-04-28 ENCOUNTER — Other Ambulatory Visit: Payer: Self-pay | Admitting: Gastroenterology

## 2013-04-28 DIAGNOSIS — R1310 Dysphagia, unspecified: Secondary | ICD-10-CM

## 2013-04-29 ENCOUNTER — Other Ambulatory Visit: Payer: Self-pay | Admitting: Gastroenterology

## 2013-04-29 DIAGNOSIS — R1310 Dysphagia, unspecified: Secondary | ICD-10-CM

## 2013-05-03 ENCOUNTER — Other Ambulatory Visit: Payer: BC Managed Care – PPO

## 2013-05-09 ENCOUNTER — Other Ambulatory Visit: Payer: BC Managed Care – PPO

## 2013-05-10 ENCOUNTER — Ambulatory Visit: Payer: BC Managed Care – PPO | Admitting: Dietician

## 2013-08-02 ENCOUNTER — Encounter (INDEPENDENT_AMBULATORY_CARE_PROVIDER_SITE_OTHER): Payer: Self-pay

## 2013-11-08 ENCOUNTER — Encounter: Payer: Self-pay | Admitting: Cardiology

## 2014-08-02 ENCOUNTER — Other Ambulatory Visit: Payer: Self-pay

## 2014-08-09 ENCOUNTER — Other Ambulatory Visit: Payer: Self-pay | Admitting: *Deleted

## 2014-08-09 ENCOUNTER — Other Ambulatory Visit: Payer: Self-pay

## 2014-08-09 DIAGNOSIS — M8580 Other specified disorders of bone density and structure, unspecified site: Secondary | ICD-10-CM

## 2014-08-11 ENCOUNTER — Other Ambulatory Visit: Payer: Self-pay

## 2014-08-14 ENCOUNTER — Ambulatory Visit
Admission: RE | Admit: 2014-08-14 | Discharge: 2014-08-14 | Disposition: A | Discharge status: Home / Self Care | Payer: BLUE CROSS/BLUE SHIELD | Source: Ambulatory Visit | Attending: General Surgery | Admitting: General Surgery

## 2016-02-22 ENCOUNTER — Encounter (HOSPITAL_COMMUNITY): Payer: Self-pay

## 2016-04-22 ENCOUNTER — Other Ambulatory Visit: Payer: Self-pay | Admitting: Obstetrics and Gynecology

## 2016-04-22 DIAGNOSIS — Z1231 Encounter for screening mammogram for malignant neoplasm of breast: Secondary | ICD-10-CM

## 2016-06-06 ENCOUNTER — Ambulatory Visit
Admission: RE | Admit: 2016-06-06 | Discharge: 2016-06-06 | Disposition: A | Discharge status: Home / Self Care | Payer: 59 | Source: Ambulatory Visit | Attending: Obstetrics and Gynecology | Admitting: Obstetrics and Gynecology

## 2016-06-06 DIAGNOSIS — Z1231 Encounter for screening mammogram for malignant neoplasm of breast: Secondary | ICD-10-CM

## 2016-06-09 ENCOUNTER — Other Ambulatory Visit: Payer: Self-pay | Admitting: Obstetrics and Gynecology

## 2016-06-09 DIAGNOSIS — R928 Other abnormal and inconclusive findings on diagnostic imaging of breast: Secondary | ICD-10-CM

## 2016-06-11 ENCOUNTER — Ambulatory Visit
Admission: RE | Admit: 2016-06-11 | Discharge: 2016-06-11 | Disposition: A | Discharge status: Home / Self Care | Payer: 59 | Source: Ambulatory Visit | Attending: Obstetrics and Gynecology | Admitting: Obstetrics and Gynecology

## 2016-06-11 ENCOUNTER — Other Ambulatory Visit: Payer: Self-pay | Admitting: Obstetrics and Gynecology

## 2016-06-11 DIAGNOSIS — R928 Other abnormal and inconclusive findings on diagnostic imaging of breast: Secondary | ICD-10-CM

## 2016-06-11 DIAGNOSIS — N6310 Unspecified lump in the right breast, unspecified quadrant: Secondary | ICD-10-CM

## 2016-07-01 ENCOUNTER — Ambulatory Visit: Payer: Self-pay | Admitting: General Surgery

## 2016-07-01 ENCOUNTER — Other Ambulatory Visit: Payer: Self-pay | Admitting: General Surgery

## 2016-07-01 DIAGNOSIS — N6091 Unspecified benign mammary dysplasia of right breast: Secondary | ICD-10-CM

## 2016-07-03 ENCOUNTER — Encounter (HOSPITAL_BASED_OUTPATIENT_CLINIC_OR_DEPARTMENT_OTHER): Payer: Self-pay | Admitting: *Deleted

## 2016-07-07 ENCOUNTER — Encounter (HOSPITAL_BASED_OUTPATIENT_CLINIC_OR_DEPARTMENT_OTHER)
Admission: RE | Admit: 2016-07-07 | Discharge: 2016-07-07 | Disposition: A | Discharge status: Home / Self Care | Payer: 59 | Source: Ambulatory Visit | Attending: General Surgery | Admitting: General Surgery

## 2016-07-07 DIAGNOSIS — N63 Unspecified lump in unspecified breast: Secondary | ICD-10-CM | POA: Diagnosis present

## 2016-07-07 DIAGNOSIS — D241 Benign neoplasm of right breast: Secondary | ICD-10-CM | POA: Diagnosis not present

## 2016-07-07 DIAGNOSIS — I10 Essential (primary) hypertension: Secondary | ICD-10-CM | POA: Diagnosis not present

## 2016-07-07 DIAGNOSIS — Z8249 Family history of ischemic heart disease and other diseases of the circulatory system: Secondary | ICD-10-CM | POA: Diagnosis not present

## 2016-07-07 DIAGNOSIS — F419 Anxiety disorder, unspecified: Secondary | ICD-10-CM | POA: Diagnosis not present

## 2016-07-07 DIAGNOSIS — Z803 Family history of malignant neoplasm of breast: Secondary | ICD-10-CM | POA: Diagnosis not present

## 2016-07-07 DIAGNOSIS — Z801 Family history of malignant neoplasm of trachea, bronchus and lung: Secondary | ICD-10-CM | POA: Diagnosis not present

## 2016-07-07 DIAGNOSIS — Z800 Family history of malignant neoplasm of digestive organs: Secondary | ICD-10-CM | POA: Diagnosis not present

## 2016-07-07 DIAGNOSIS — Z7982 Long term (current) use of aspirin: Secondary | ICD-10-CM | POA: Diagnosis not present

## 2016-07-07 DIAGNOSIS — Z79899 Other long term (current) drug therapy: Secondary | ICD-10-CM | POA: Diagnosis not present

## 2016-07-07 DIAGNOSIS — N62 Hypertrophy of breast: Secondary | ICD-10-CM | POA: Diagnosis not present

## 2016-07-07 DIAGNOSIS — M1990 Unspecified osteoarthritis, unspecified site: Secondary | ICD-10-CM | POA: Diagnosis not present

## 2016-07-07 LAB — BASIC METABOLIC PANEL
Anion gap: 10 (ref 5–15)
BUN: 19 mg/dL (ref 6–20)
CHLORIDE: 100 mmol/L — AB (ref 101–111)
CO2: 27 mmol/L (ref 22–32)
Calcium: 9.5 mg/dL (ref 8.9–10.3)
Creatinine, Ser: 0.97 mg/dL (ref 0.44–1.00)
GFR calc non Af Amer: >60 mL/min (ref >60)
Glucose, Bld: 92 mg/dL (ref 65–99)
POTASSIUM: 4.4 mmol/L (ref 3.5–5.1)
Sodium: 137 mmol/L (ref 135–145)

## 2016-07-07 NOTE — Progress Notes (Signed)
Boost drink given and instructed to complete by 1145 day of surgery.  Pt verbalized understanding.

## 2016-07-08 ENCOUNTER — Other Ambulatory Visit: Payer: Self-pay | Admitting: General Surgery

## 2016-07-08 ENCOUNTER — Ambulatory Visit
Admission: RE | Admit: 2016-07-08 | Discharge: 2016-07-08 | Disposition: A | Discharge status: Home / Self Care | Payer: 59 | Source: Ambulatory Visit | Attending: General Surgery | Admitting: General Surgery

## 2016-07-08 DIAGNOSIS — N6091 Unspecified benign mammary dysplasia of right breast: Secondary | ICD-10-CM

## 2016-07-10 ENCOUNTER — Ambulatory Visit
Admission: RE | Admit: 2016-07-10 | Discharge: 2016-07-10 | Disposition: A | Discharge status: Home / Self Care | Payer: 59 | Source: Ambulatory Visit | Attending: General Surgery | Admitting: General Surgery

## 2016-07-10 ENCOUNTER — Encounter (HOSPITAL_BASED_OUTPATIENT_CLINIC_OR_DEPARTMENT_OTHER)
Admission: RE | Disposition: A | Discharge status: Home / Self Care | Payer: Self-pay | Source: Ambulatory Visit | Attending: General Surgery

## 2016-07-10 ENCOUNTER — Encounter (HOSPITAL_BASED_OUTPATIENT_CLINIC_OR_DEPARTMENT_OTHER): Payer: Self-pay

## 2016-07-10 ENCOUNTER — Ambulatory Visit (HOSPITAL_BASED_OUTPATIENT_CLINIC_OR_DEPARTMENT_OTHER): Payer: 59 | Admitting: Anesthesiology

## 2016-07-10 ENCOUNTER — Ambulatory Visit (HOSPITAL_BASED_OUTPATIENT_CLINIC_OR_DEPARTMENT_OTHER)
Admission: RE | Admit: 2016-07-10 | Discharge: 2016-07-10 | Disposition: A | Discharge status: Home / Self Care | Payer: 59 | Source: Ambulatory Visit | Attending: General Surgery | Admitting: General Surgery

## 2016-07-10 DIAGNOSIS — Z800 Family history of malignant neoplasm of digestive organs: Secondary | ICD-10-CM | POA: Insufficient documentation

## 2016-07-10 DIAGNOSIS — Z801 Family history of malignant neoplasm of trachea, bronchus and lung: Secondary | ICD-10-CM | POA: Insufficient documentation

## 2016-07-10 DIAGNOSIS — N62 Hypertrophy of breast: Secondary | ICD-10-CM | POA: Insufficient documentation

## 2016-07-10 DIAGNOSIS — Z7982 Long term (current) use of aspirin: Secondary | ICD-10-CM | POA: Insufficient documentation

## 2016-07-10 DIAGNOSIS — M1990 Unspecified osteoarthritis, unspecified site: Secondary | ICD-10-CM | POA: Insufficient documentation

## 2016-07-10 DIAGNOSIS — Z803 Family history of malignant neoplasm of breast: Secondary | ICD-10-CM | POA: Insufficient documentation

## 2016-07-10 DIAGNOSIS — F419 Anxiety disorder, unspecified: Secondary | ICD-10-CM | POA: Insufficient documentation

## 2016-07-10 DIAGNOSIS — Z79899 Other long term (current) drug therapy: Secondary | ICD-10-CM | POA: Insufficient documentation

## 2016-07-10 DIAGNOSIS — I10 Essential (primary) hypertension: Secondary | ICD-10-CM | POA: Insufficient documentation

## 2016-07-10 DIAGNOSIS — D241 Benign neoplasm of right breast: Secondary | ICD-10-CM | POA: Insufficient documentation

## 2016-07-10 DIAGNOSIS — N6091 Unspecified benign mammary dysplasia of right breast: Secondary | ICD-10-CM

## 2016-07-10 DIAGNOSIS — Z8249 Family history of ischemic heart disease and other diseases of the circulatory system: Secondary | ICD-10-CM | POA: Insufficient documentation

## 2016-07-10 HISTORY — PX: BREAST LUMPECTOMY WITH RADIOACTIVE SEED LOCALIZATION: SHX6424

## 2016-07-10 SURGERY — BREAST LUMPECTOMY WITH RADIOACTIVE SEED LOCALIZATION
Anesthesia: General | Site: Breast | Laterality: Right

## 2016-07-10 MED ORDER — GABAPENTIN 300 MG PO CAPS
300.0000 mg | ORAL_CAPSULE | ORAL | Status: AC
  Administered 2016-07-10: 300 mg via ORAL

## 2016-07-10 MED ORDER — ACETAMINOPHEN 500 MG PO TABS
1000.0000 mg | ORAL_TABLET | ORAL | Status: AC
  Administered 2016-07-10: 1000 mg via ORAL

## 2016-07-10 MED ORDER — LACTATED RINGERS IV SOLN
INTRAVENOUS | Status: DC
  Administered 2016-07-10: 14:00:00 via INTRAVENOUS

## 2016-07-10 MED ORDER — ONDANSETRON HCL 4 MG/2ML IJ SOLN
INTRAMUSCULAR | Status: DC | PRN
  Administered 2016-07-10: 4 mg via INTRAVENOUS

## 2016-07-10 MED ORDER — FENTANYL CITRATE (PF) 100 MCG/2ML IJ SOLN: 25.0000 ug | INTRAMUSCULAR | Status: DC | PRN

## 2016-07-10 MED ORDER — CELECOXIB 200 MG PO CAPS
ORAL_CAPSULE | ORAL | Status: AC
  Filled 2016-07-10: qty 2

## 2016-07-10 MED ORDER — CHLORHEXIDINE GLUCONATE CLOTH 2 % EX PADS: 6.0000 | MEDICATED_PAD | Freq: Once | CUTANEOUS | Status: DC

## 2016-07-10 MED ORDER — FENTANYL CITRATE (PF) 100 MCG/2ML IJ SOLN
INTRAMUSCULAR | Status: AC
  Filled 2016-07-10: qty 2

## 2016-07-10 MED ORDER — GABAPENTIN 300 MG PO CAPS
ORAL_CAPSULE | ORAL | Status: AC
  Filled 2016-07-10: qty 1

## 2016-07-10 MED ORDER — ACETAMINOPHEN 500 MG PO TABS
ORAL_TABLET | ORAL | Status: AC
  Filled 2016-07-10: qty 2

## 2016-07-10 MED ORDER — HYDROCODONE-ACETAMINOPHEN 5-325 MG PO TABS: 1.0000 | ORAL_TABLET | ORAL | 0 refills | Status: DC | PRN

## 2016-07-10 MED ORDER — OXYCODONE HCL 5 MG/5ML PO SOLN: 5.0000 mg | Freq: Once | ORAL | Status: DC | PRN

## 2016-07-10 MED ORDER — SCOPOLAMINE 1 MG/3DAYS TD PT72
1.0000 | MEDICATED_PATCH | Freq: Once | TRANSDERMAL | Status: DC | PRN
Start: 2016-07-10 — End: 2016-07-10

## 2016-07-10 MED ORDER — CEFAZOLIN SODIUM-DEXTROSE 2-4 GM/100ML-% IV SOLN
INTRAVENOUS | Status: AC
  Filled 2016-07-10: qty 100

## 2016-07-10 MED ORDER — FENTANYL CITRATE (PF) 100 MCG/2ML IJ SOLN
50.0000 ug | INTRAMUSCULAR | Status: DC | PRN
  Administered 2016-07-10: 100 ug via INTRAVENOUS

## 2016-07-10 MED ORDER — EPHEDRINE SULFATE 50 MG/ML IJ SOLN
INTRAMUSCULAR | Status: DC | PRN
  Administered 2016-07-10: 10 mg via INTRAVENOUS

## 2016-07-10 MED ORDER — BUPIVACAINE-EPINEPHRINE 0.5% -1:200000 IJ SOLN
INTRAMUSCULAR | Status: DC | PRN
  Administered 2016-07-10: 20 mL

## 2016-07-10 MED ORDER — MIDAZOLAM HCL 2 MG/2ML IJ SOLN
1.0000 mg | INTRAMUSCULAR | Status: DC | PRN
  Administered 2016-07-10: 2 mg via INTRAVENOUS

## 2016-07-10 MED ORDER — CELECOXIB 400 MG PO CAPS
400.0000 mg | ORAL_CAPSULE | ORAL | Status: AC
  Administered 2016-07-10: 400 mg via ORAL

## 2016-07-10 MED ORDER — EPHEDRINE 5 MG/ML INJ
INTRAVENOUS | Status: AC
  Filled 2016-07-10: qty 10

## 2016-07-10 MED ORDER — LIDOCAINE 2% (20 MG/ML) 5 ML SYRINGE
INTRAMUSCULAR | Status: DC | PRN
  Administered 2016-07-10: 50 mg via INTRAVENOUS

## 2016-07-10 MED ORDER — ONDANSETRON HCL 4 MG/2ML IJ SOLN: 4.0000 mg | Freq: Four times a day (QID) | INTRAMUSCULAR | Status: DC | PRN

## 2016-07-10 MED ORDER — CEFAZOLIN SODIUM-DEXTROSE 2-4 GM/100ML-% IV SOLN
2.0000 g | INTRAVENOUS | Status: AC
  Administered 2016-07-10: 2 g via INTRAVENOUS

## 2016-07-10 MED ORDER — PROPOFOL 10 MG/ML IV BOLUS
INTRAVENOUS | Status: DC | PRN
  Administered 2016-07-10: 150 mg via INTRAVENOUS

## 2016-07-10 MED ORDER — MIDAZOLAM HCL 2 MG/2ML IJ SOLN
INTRAMUSCULAR | Status: AC
  Filled 2016-07-10: qty 2

## 2016-07-10 MED ORDER — OXYCODONE HCL 5 MG PO TABS: 5.0000 mg | ORAL_TABLET | Freq: Once | ORAL | Status: DC | PRN

## 2016-07-10 SURGICAL SUPPLY — 42 items
APPLIER CLIP 9.375 MED OPEN (MISCELLANEOUS)
BLADE SURG 15 STRL LF DISP TIS (BLADE) ×1 IMPLANT
BLADE SURG 15 STRL SS (BLADE) ×1
CANISTER SUC SOCK COL 7IN (MISCELLANEOUS) IMPLANT
CANISTER SUCT 1200ML W/VALVE (MISCELLANEOUS) IMPLANT
CHLORAPREP W/TINT 26ML (MISCELLANEOUS) ×2 IMPLANT
CLIP APPLIE 9.375 MED OPEN (MISCELLANEOUS) IMPLANT
COVER BACK TABLE 60X90IN (DRAPES) ×2 IMPLANT
COVER MAYO STAND STRL (DRAPES) ×2 IMPLANT
COVER PROBE W GEL 5X96 (DRAPES) ×2 IMPLANT
DECANTER SPIKE VIAL GLASS SM (MISCELLANEOUS) IMPLANT
DERMABOND ADVANCED (GAUZE/BANDAGES/DRESSINGS) ×1
DERMABOND ADVANCED .7 DNX12 (GAUZE/BANDAGES/DRESSINGS) ×1 IMPLANT
DEVICE DUBIN W/COMP PLATE 8390 (MISCELLANEOUS) ×2 IMPLANT
DRAPE LAPAROSCOPIC ABDOMINAL (DRAPES) ×2 IMPLANT
DRAPE UTILITY XL STRL (DRAPES) ×2 IMPLANT
ELECT COATED BLADE 2.86 ST (ELECTRODE) ×2 IMPLANT
ELECT REM PT RETURN 9FT ADLT (ELECTROSURGICAL) ×2
ELECTRODE REM PT RTRN 9FT ADLT (ELECTROSURGICAL) ×1 IMPLANT
GLOVE BIO SURGEON STRL SZ7.5 (GLOVE) ×4 IMPLANT
GLOVE BIOGEL M STRL SZ7.5 (GLOVE) ×2 IMPLANT
GLOVE BIOGEL PI IND STRL 8 (GLOVE) ×1 IMPLANT
GLOVE BIOGEL PI INDICATOR 8 (GLOVE) ×1
GOWN STRL REUS W/ TWL LRG LVL3 (GOWN DISPOSABLE) ×2 IMPLANT
GOWN STRL REUS W/TWL LRG LVL3 (GOWN DISPOSABLE) ×2
ILLUMINATOR WAVEGUIDE N/F (MISCELLANEOUS) IMPLANT
KIT MARKER MARGIN INK (KITS) ×2 IMPLANT
LIGHT WAVEGUIDE WIDE FLAT (MISCELLANEOUS) IMPLANT
NEEDLE HYPO 25X1 1.5 SAFETY (NEEDLE) ×2 IMPLANT
NS IRRIG 1000ML POUR BTL (IV SOLUTION) IMPLANT
PACK BASIN DAY SURGERY FS (CUSTOM PROCEDURE TRAY) ×2 IMPLANT
PENCIL BUTTON HOLSTER BLD 10FT (ELECTRODE) ×2 IMPLANT
SLEEVE SCD COMPRESS KNEE MED (MISCELLANEOUS) ×2 IMPLANT
SPONGE LAP 18X18 X RAY DECT (DISPOSABLE) ×2 IMPLANT
SUT MON AB 4-0 PC3 18 (SUTURE) ×2 IMPLANT
SUT SILK 2 0 SH (SUTURE) IMPLANT
SUT VICRYL 3-0 CR8 SH (SUTURE) ×2 IMPLANT
SYR CONTROL 10ML LL (SYRINGE) IMPLANT
TOWEL OR 17X24 6PK STRL BLUE (TOWEL DISPOSABLE) ×2 IMPLANT
TOWEL OR NON WOVEN STRL DISP B (DISPOSABLE) IMPLANT
TUBE CONNECTING 20X1/4 (TUBING) ×2 IMPLANT
YANKAUER SUCT BULB TIP NO VENT (SUCTIONS) IMPLANT

## 2016-07-10 NOTE — Interval H&P Note (Signed)
History and Physical Interval Note:  07/10/2016 3:45 PM  [name] [name]  has presented today for surgery, with the diagnosis of right breast atypical lobular hyperplasia  The various methods of treatment have been discussed with the patient and family. After consideration of risks, benefits and other options for treatment, the patient has consented to  Procedure(s): RIGHT BREAST LUMPECTOMY WITH RADIOACTIVE SEED LOCALIZATION (Right) as a surgical intervention .  The patient's history has been reviewed, patient examined, no change in status, stable for surgery.  I have reviewed the patient's chart and labs.  Questions were answered to the patient's satisfaction.     TOTH III,Braelyn Bordonaro S

## 2016-07-10 NOTE — Anesthesia Preprocedure Evaluation (Signed)
Anesthesia Evaluation  Patient identified by MRN, date of birth, ID band Patient awake    Reviewed: Allergy & Precautions, H&P , NPO status , Patient's Chart, lab work & pertinent test results  Airway Mallampati: II   Neck ROM: full    Dental   Pulmonary neg pulmonary ROS,    breath sounds clear to auscultation       Cardiovascular hypertension,  Rhythm:regular Rate:Normal     Neuro/Psych Anxiety    GI/Hepatic   Endo/Other    Renal/GU      Musculoskeletal  (+) Arthritis ,   Abdominal   Peds  Hematology   Anesthesia Other Findings   Reproductive/Obstetrics                             Anesthesia Physical Anesthesia Plan  ASA: II  Anesthesia Plan: General   Post-op Pain Management:    Induction: Intravenous  Airway Management Planned: LMA  Additional Equipment:   Intra-op Plan:   Post-operative Plan:   Informed Consent: I have reviewed the patients History and Physical, chart, labs and discussed the procedure including the risks, benefits and alternatives for the proposed anesthesia with the patient or authorized representative who has indicated his/her understanding and acceptance.     Plan Discussed with: CRNA, Anesthesiologist and Surgeon  Anesthesia Plan Comments:         Anesthesia Quick Evaluation

## 2016-07-10 NOTE — Discharge Instructions (Signed)

## 2016-07-10 NOTE — Transfer of Care (Signed)
Immediate Anesthesia Transfer of Care Note  Patient: [name] [name]  Procedure(s) Performed: Procedure(s): RIGHT BREAST LUMPECTOMY WITH RADIOACTIVE SEED LOCALIZATION (Right)  Patient Location: PACU  Anesthesia Type:General  Level of Consciousness: awake and sedated  Airway & Oxygen Therapy: Patient Spontanous Breathing  Post-op Assessment: Report given to RN and Post -op Vital signs reviewed and stable  Post vital signs: Reviewed  Last Vitals:  Vitals:   07/10/16 1356  BP: 122/64  Pulse: (!) 57  Resp: 18  Temp: 36.8 C    Last Pain:  Vitals:   07/10/16 1356  TempSrc: Oral         Complications: No apparent anesthesia complications

## 2016-07-10 NOTE — Anesthesia Procedure Notes (Signed)
Procedure Name: LMA Insertion Performed by: Kinsley Holderman W Pre-anesthesia Checklist: Patient identified, Emergency Drugs available, Suction available and Patient being monitored Patient Re-evaluated:Patient Re-evaluated prior to inductionOxygen Delivery Method: Circle system utilized Preoxygenation: Pre-oxygenation with 100% oxygen Intubation Type: IV induction Ventilation: Mask ventilation without difficulty LMA: LMA inserted LMA Size: 4.0 Number of attempts: 1 Placement Confirmation: positive ETCO2 Tube secured with: Tape Dental Injury: Teeth and Oropharynx as per pre-operative assessment        

## 2016-07-10 NOTE — Op Note (Signed)
07/10/2016  4:50 PM  PATIENT:  [name] [name]  66 y.o. female  PRE-OPERATIVE DIAGNOSIS:  right breast atypical lobular hyperplasia  POST-OPERATIVE DIAGNOSIS:  right breast atypical lobular hyperplasia  PROCEDURE:  Procedure(s): RIGHT BREAST LUMPECTOMY WITH RADIOACTIVE SEED LOCALIZATION X 2 (Right)  SURGEON:  Surgeon(s) and Role:    * Jovita Kussmaul, MD - Primary  PHYSICIAN ASSISTANT:   ASSISTANTS: none   ANESTHESIA:   local and general  EBL:  Total I/O In: 1000 [I.V.:1000] Out: 10 [Blood:10]  BLOOD ADMINISTERED:none  DRAINS: none   LOCAL MEDICATIONS USED:  MARCAINE     SPECIMEN:  Source of Specimen:  right breast tissue  DISPOSITION OF SPECIMEN:  PATHOLOGY  COUNTS:  YES  TOURNIQUET:  * No tourniquets in log *  DICTATION: .Dragon Dictation   After informed consent was obtained the patient was brought to the operating room and placed in the supine position on the operating table. After adequate induction of general anesthesia the patient's right breast was prepped with ChloraPrep, allowed to dry, and draped in usual sterile manner. An appropriate timeout was performed. The 2 I-125 seeds were placed in the outer aspect of the right breast to mark 2 adjacent areas of atypical lobular hyperplasia. The neoprobe was set to I-125 in the 2 areas were readily identified with increased radioactivity. A laterally oriented incision was then made with a 15 blade knife overlying the 2 areas. The incision was carried through the skin and subcutaneous tissue sharply with the electrocautery. While checking the area of radioactivity frequently a circular portion of breast tissue was excised sharply around the 2 radioactive seeds. Once the specimen was removed it was oriented with the appropriate paint colors. A specimen radiograph was obtained that showed both clips and seeds to be within the specimen. The specimen was then sent to pathology for further evaluation. Hemostasis was achieved  using the Bovie electrocautery. The wound was infiltrated with quarter percent Marcaine and irrigated with saline. The deep layer of the wound was then closed with layers of interrupted 3-0 Vicryl stitches. The skin was then closed with interrupted 4-0 Monocryl subcuticular stitches. Dermabond dressings were applied. The patient tolerated the procedure well. At the end of the case all needle sponge and instrument counts were correct. The patient was then awakened and taken to recovery in stable condition.  PLAN OF CARE: Discharge to home after PACU  PATIENT DISPOSITION:  PACU - hemodynamically stable.   Delay start of Pharmacological VTE agent (>24hrs) due to surgical blood loss or risk of bleeding: not applicable

## 2016-07-10 NOTE — H&P (Signed)
[name] [name]  Location: [facility] Patient #: [id number] DOB: [birth year] Married / Language: English / Race: White Female   History of Present Illness  The patient is a 66 year old female who presents with a breast mass. We are asked to see the patient in consultation by Dr. Kendall Flack to evaluate her for right breast atypical lobular hyperplasia. The patient is a 66 year old white female who recently went for a routine screening mammogram. At that time she was found to have an abnormality in the upper outer quadrant of the right breast. There were actually 2 small adjacent areas by ultrasound. Both of these were biopsied and came back as atypical lobular hyperplasia. She denies any breast pain or discharge from the nipple. She does not take any hormone replacement or smoke. She does have a history of breast cancer in a paternal aunt as well as pancreatic cancer in a different maternal aunt. Her mother had a history of lung cancer and her father had a history of coronary artery disease.   Allergies  No Known Drug Allergies   Medication History  Bisoprolol-Hydrochlorothiazide (2.5-6.25MG  Tablet, Oral) Active. Atorvastatin Calcium (10MG  Tablet, Oral) Active. Diclofenac Sodium (50MG  Tablet DR, Oral) Active. FLUoxetine HCl (20MG  Capsule, Oral) Active. Zolpidem Tartrate (10MG  Tablet, Oral) Active. Baby Aspirin (81MG  Tablet Chewable, Oral) Active. Calcium Gluconate (500MG  Tablet, Oral) Active. Multi Vitamin/Fluoride (1MG  Tablet Chewable, Oral) Active. Ferrex 150 (150MG  Capsule, Oral) Active. Diclofenac Sodium (75MG  Tablet DR, Oral daily) Active. Medications Reconciled    Review of Systems General Not Present- Appetite Loss, Chills, Fatigue, Fever, Night Sweats, Weight Gain and Weight Loss. Skin Not Present- Change in Wart/Mole, Dryness, Hives, Jaundice, New Lesions, Non-Healing Wounds, Rash and Ulcer. HEENT Not Present- Earache, Hearing Loss,  Hoarseness, Nose Bleed, Oral Ulcers, Ringing in the Ears, Seasonal Allergies, Sinus Pain, Sore Throat, Visual Disturbances, Wears glasses/contact lenses and Yellow Eyes. Respiratory Not Present- Bloody sputum, Chronic Cough, Difficulty Breathing, Snoring and Wheezing. Breast Not Present- Breast Mass, Breast Pain, Nipple Discharge and Skin Changes. Cardiovascular Not Present- Chest Pain, Difficulty Breathing Lying Down, Leg Cramps, Palpitations, Rapid Heart Rate, Shortness of Breath and Swelling of Extremities. Gastrointestinal Not Present- Abdominal Pain, Bloating, Bloody Stool, Change in Bowel Habits, Chronic diarrhea, Constipation, Difficulty Swallowing, Excessive gas, Gets full quickly at meals, Hemorrhoids, Indigestion, Nausea, Rectal Pain and Vomiting. Female Genitourinary Not Present- Frequency, Nocturia, Painful Urination, Pelvic Pain and Urgency. Musculoskeletal Not Present- Back Pain, Joint Pain, Joint Stiffness, Muscle Pain, Muscle Weakness and Swelling of Extremities. Neurological Not Present- Decreased Memory, Fainting, Headaches, Numbness, Seizures, Tingling, Tremor, Trouble walking and Weakness. Psychiatric Not Present- Anxiety, Bipolar, Change in Sleep Pattern, Depression, Fearful and Frequent crying. Endocrine Not Present- Cold Intolerance, Excessive Hunger, Hair Changes, Heat Intolerance, Hot flashes and New Diabetes. Hematology Not Present- Easy Bruising, Excessive bleeding, Gland problems, HIV and Persistent Infections.  Vitals  Weight: 171.6 lb Height: 62in Body Surface Area: 1.79 m Body Mass Index: 31.39 kg/m  Temp.: 52F  Pulse: 66 (Regular)  BP: 132/88 (Sitting, Left Arm, Standard)       Physical Exam  General Mental Status-Alert. General Appearance-Consistent with stated age. Hydration-Well hydrated. Voice-Normal.  Head and Neck Head-normocephalic, atraumatic with no lesions or palpable masses. Trachea-midline. Thyroid Gland  Characteristics - normal size and consistency.  Eye Eyeball - Bilateral-Extraocular movements intact. Sclera/Conjunctiva - Bilateral-No scleral icterus.  Chest and Lung Exam Chest and lung exam reveals -quiet, even and easy respiratory effort with no use of accessory muscles and on auscultation, normal  breath sounds, no adventitious sounds and normal vocal resonance. Inspection Chest Wall - Normal. Back - normal.  Breast Note: There is no palpable mass in either breast. She does have symmetric density to the breast tissue in the upper outer quadrant of both breasts. There is no palpable axillary, supra clavicular, or cervical lymphadenopathy.   Cardiovascular Cardiovascular examination reveals -normal heart sounds, regular rate and rhythm with no murmurs and normal pedal pulses bilaterally.  Abdomen Inspection Inspection of the abdomen reveals - No Hernias. Skin - Scar - no surgical scars. Palpation/Percussion Palpation and Percussion of the abdomen reveal - Soft, Non Tender, No Rebound tenderness, No Rigidity (guarding) and No hepatosplenomegaly. Auscultation Auscultation of the abdomen reveals - Bowel sounds normal.  Neurologic Neurologic evaluation reveals -alert and oriented x 3 with no impairment of recent or remote memory. Mental Status-Normal.  Musculoskeletal Normal Exam - Left-Upper Extremity Strength Normal and Lower Extremity Strength Normal. Normal Exam - Right-Upper Extremity Strength Normal and Lower Extremity Strength Normal.  Lymphatic Head & Neck  General Head & Neck Lymphatics: Bilateral - Description - Normal. Axillary  General Axillary Region: Bilateral - Description - Normal. Tenderness - Non Tender. Femoral & Inguinal  Generalized Femoral & Inguinal Lymphatics: Bilateral - Description - Normal. Tenderness - Non Tender.    Assessment & Plan  ATYPICAL LOBULAR HYPERPLASIA OF RIGHT BREAST (N60.91) Impression: The patient appears to  have 2 small adjacent areas of atypical lobular hyperplasia in the upper outer right breast. Because of its abnormal appearance and because it is considered a high risk lesion and I would recommend that these areas be removed. She would also like to have this done. I have discussed with her in detail the risks and benefits of the operation to do this as well as some of the technical aspects and she understands and wishes to proceed. I will plan for a right breast radioactive seed localized lumpectomy. I will also refer her to the high risk clinic to talk about risk reduction. Current Plans Referred to Oncology, for evaluation and follow up (Oncology). Routine.

## 2016-07-10 NOTE — Anesthesia Postprocedure Evaluation (Addendum)
Anesthesia Post Note  Patient: [name] [name]  Procedure(s) Performed: Procedure(s) (LRB): RIGHT BREAST LUMPECTOMY WITH RADIOACTIVE SEED LOCALIZATION (Right)  Patient location during evaluation: PACU Anesthesia Type: General Level of consciousness: awake and alert Pain management: pain level controlled Vital Signs Assessment: post-procedure vital signs reviewed and stable Respiratory status: spontaneous breathing, nonlabored ventilation, respiratory function stable and patient connected to nasal cannula oxygen Cardiovascular status: blood pressure returned to baseline and stable Postop Assessment: no signs of nausea or vomiting Anesthetic complications: no       Last Vitals:  Vitals:   07/10/16 1701 07/10/16 1715  BP: (!) 156/141 123/71  Pulse: 77 70  Resp: 18 15  Temp: 36.8 C     Last Pain:  Vitals:   07/10/16 1715  TempSrc:   PainSc: 0-No pain                 Effie Berkshire

## 2016-07-11 ENCOUNTER — Encounter (HOSPITAL_BASED_OUTPATIENT_CLINIC_OR_DEPARTMENT_OTHER): Payer: Self-pay | Admitting: General Surgery

## 2016-07-16 ENCOUNTER — Encounter: Payer: Self-pay | Admitting: Hematology

## 2016-07-16 ENCOUNTER — Telehealth: Payer: Self-pay | Admitting: Hematology

## 2016-07-16 NOTE — Telephone Encounter (Signed)
An appt has been scheduled for the pt to see Dr. Burr Medico on 4/11 at 11am per her request. Demographics verified. Pt aware to arrive 30 minutes early. Letter mailed.

## 2016-08-20 ENCOUNTER — Encounter: Payer: 59 | Admitting: Hematology

## 2016-09-12 ENCOUNTER — Telehealth: Payer: Self-pay | Admitting: Hematology

## 2016-09-12 NOTE — Telephone Encounter (Signed)
Patient called and due to delay in travel moved 5/7 appointment to 5/23. Patient has new date/time.

## 2016-09-15 ENCOUNTER — Encounter: Payer: 59 | Admitting: Hematology

## 2016-09-29 NOTE — Progress Notes (Signed)
[name] [name]  [phone] Fax:([phone]) [place] Note   Patient Care Team: Selinda Orion as PCP - General (Physician Assistant) Himmelrich, Bryson Ha, RD (Inactive) as Dietitian (Bariatrics) 10/01/2016  Referring physician: Dr. Marlou Starks  CHIEF COMPLAINTS/PURPOSE OF CONSULTATION:  Atypical lobular hyperplasia of the right breast.  HISTORY OF PRESENTING ILLNESS: 10/01/16 [name] [name] 66 y.o. 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 She had a mammogram screening 06/06/16 that showed evidence of a mass in her right breast. She had subsequent [place] and Biopsy 06/11/16 that showed evidence of possible lobular neoplasia. She has had a right breast lumpectomy by Dr. Marlou Starks 07/10/16. Now she is here for a consult post surgery.  She has a history of a complete hysterectomy due to continuous menorrhagia. Her mother had cancer, maternal aunt had pancreatic and breast cancer. And grandmother from liver cancer. She drinks some wine daily with her husband.   Sh presents to the clinic today with her husband and she is experiencing a stomach pain under her right breast for the past week. She is taking Pepto. She has some Arthritis that is flaring up because she is not taking antiinflammatory. In her hips, knees, elbows and hands. Improved hot flashes and mood swings. She takes Fluoxitine for anxiety and she is also taking PROZAC. She has been on it for 20 years. She has no swelling and no residual pain from surgery. She has not been active recently but she usually bikes with her husband. She will be on Medicare next month and she has full supplements with [place] [facility].    GYN HISTORY  Menarchal: 16 LMP: Complete hysterectomy in her 67s Contraceptive: none  HRT: none  G2P2    MEDICAL HISTORY:  Past Medical History:  Diagnosis Date  . Arthritis    feet,knees,hands  . Cancer ([place])    melanoma  . Chronic anxiety   .  Hyperlipidemia   . Hypertension   . Obesity     SURGICAL HISTORY: Past Surgical History:  Procedure Laterality Date  . ABDOMINAL HYSTERECTOMY    . APPENDECTOMY  1978  . BREAST LUMPECTOMY WITH RADIOACTIVE SEED LOCALIZATION Right 07/10/2016   Procedure: RIGHT BREAST LUMPECTOMY WITH RADIOACTIVE SEED LOCALIZATION;  Surgeon: Autumn Messing III, MD;  Location: [facility];  Service: General;  Laterality: Right;  . GASTRIC ROUX-EN-Y  03/09/2012   Procedure: LAPAROSCOPIC ROUX-EN-Y GASTRIC;  Surgeon: Gayland Curry, MD,FACS;  Location: [facility];  Service: General;  Laterality: N/A;  . low back  2010   x3  . right ureter surgery     in college  . UPPER GI ENDOSCOPY  03/09/2012   Procedure: UPPER GI ENDOSCOPY;  Surgeon: Gayland Curry, MD,FACS;  Location: [facility];  Service: General;  Laterality: N/A;    SOCIAL HISTORY: Social History   Social History  . Marital status: Married    Spouse name: N/A  . Number of children: 2  . Years of education: N/A   Occupational History  . retired Pharmacist, hospital    Social History Main Topics  . Smoking status: Never Smoker  . Smokeless tobacco: Never Used  . Alcohol use 0.6 - 1.2 oz/week    1 - 2 Glasses of wine per week     Comment: 1-2 glass winn daily   . Drug use: No  . Sexual activity: Not on file   Other Topics Concern  . Not on file  Social History Narrative  . No narrative on file    FAMILY HISTORY: Family History  Problem Relation Age of Onset  . Lung cancer Mother   . Cancer Mother        lung  . Heart disease Father   . HIV Brother   . Cancer Paternal Aunt 56       pancreatic cancer   . Cancer Maternal Grandmother        liver cancer   . Cancer Paternal Aunt 80       breast cancer    ALLERGIES:  has No Known Allergies.  MEDICATIONS:  Current Outpatient Prescriptions  Medication Sig Dispense Refill  . atorvastatin (LIPITOR) 10 MG tablet Take 10 mg by mouth daily.    . bisoprolol-hydrochlorothiazide (ZIAC) 5-6.25 MG  per tablet Take 1 tablet by mouth daily.    Marland Kitchen DICLOFENAC PO Take 2 tablets by mouth daily.    Marland Kitchen FLUoxetine (PROZAC) 20 MG capsule Take 20 mg by mouth daily.    Marland Kitchen HYDROcodone-acetaminophen (NORCO/VICODIN) 5-325 MG tablet Take 1-2 tablets by mouth every 4 (four) hours as needed for moderate pain or severe pain. 20 tablet 0  . zolpidem (AMBIEN) 5 MG tablet Take 5 mg by mouth at bedtime as needed for sleep.     No current facility-administered medications for this visit.     REVIEW OF SYSTEMS:   Constitutional: Denies fevers, chills or abnormal night sweats (+) hot flashes  Eyes: Denies blurriness of vision, double vision or watery eyes Ears, nose, mouth, throat, and face: Denies mucositis or sore throat Respiratory: Denies cough, dyspnea or wheezes Cardiovascular: Denies palpitation, chest discomfort or lower extremity swelling Gastrointestinal:  Denies nausea, heartburn or change in bowel habits (+) RUQ Stomach pain  Skin: Denies abnormal skin rashes Lymphatics: Denies new lymphadenopathy or easy bruising MSK: (+) Arthritis in her hands, elbows, hip and knees Neurological:Denies numbness, tingling or new weaknesses Behavioral/Psych: (+) mood swings and anxiety All other systems were reviewed with the patient and are negative.  PHYSICAL EXAMINATION: ECOG PERFORMANCE STATUS: 1 - Symptomatic but completely ambulatory  Vitals:   10/01/16 1130  BP: 123/77  Pulse: (!) 55  Resp: 18  Temp: 98 F (36.7 C)   Filed Weights   10/01/16 1130  Weight: 168 lb 6.4 oz (76.4 kg)   BP: 123/77 W:168.4 0:100% T:98  P:55 GENERAL:alert, no distress and comfortable SKIN: skin color, texture, turgor are normal, no rashes or significant lesions EYES: normal, conjunctiva are pink and non-injected, sclera clear OROPHARYNX:no exudate, no erythema and lips, buccal mucosa, and tongue normal  NECK: supple, thyroid normal size, non-tender, without nodularity LYMPH:  no palpable lymphadenopathy in the  cervical, axillary or inguinal LUNGS: clear to auscultation and percussion with normal breathing effort HEART: regular rate & rhythm and no murmurs and no lower extremity edema ABDOMEN:abdomen soft, non-tender and normal bowel sounds Musculoskeletal:no cyanosis of digits and no clubbing  PSYCH: alert & oriented x 3 with fluent speech NEURO: no focal motor/sensory deficits Breasts: Breast inspection showed them to be symmetrical with no nipple discharge. Palpation of the breasts and axilla revealed no obvious mass that I could appreciate. (+) incision in right lateral breast healed very well  LABORATORY DATA:  I have reviewed the data as listed CBC Latest Ref Rng & Units 01/28/2013 07/29/2012 03/11/2012  WBC 4.0 - 10.5 K/uL 4.8 6.0 10.1  Hemoglobin 12.0 - 15.0 g/dL 12.4 12.3 10.6(L)  Hematocrit 36.0 - 46.0 % 37.1 37.7 32.4(L)  Platelets  150 - 400 K/uL 373 385 317    CMP Latest Ref Rng & Units 07/07/2016 07/29/2012 03/03/2012  Glucose 65 - 99 mg/dL 92 86 81  BUN 6 - 20 mg/dL 19 22 29(H)  Creatinine 0.44 - 1.00 mg/dL 0.97 0.88 0.90  Sodium 135 - 145 mmol/L 137 139 135  Potassium 3.5 - 5.1 mmol/L 4.4 4.0 3.8  Chloride 101 - 111 mmol/L 100(L) 101 97  CO2 22 - 32 mmol/L 27 29 27   Calcium 8.9 - 10.3 mg/dL 9.5 9.4 9.8  Total Protein 6.0 - 8.3 g/dL - 6.6 7.5  Total Bilirubin 0.3 - 1.2 mg/dL - 0.4 0.2(L)  Alkaline Phos 39 - 117 U/L - 73 90  AST 0 - 37 U/L - 20 17  ALT 0 - 35 U/L - 27 19   PATHOLOGY  Diagnosis 07/10/16 by Dr. Marlou Starks Breast, lumpectomy, Right - FIBROADENOMA WITH FOCAL ATYPICAL LOBULAR HYPERPLASIA ([place]). - FIBROCYSTIC CHANGES WITH FOCAL ATYPICAL LOBULAR HYPERPLASIA ([place]). - ALH FOCALLY LESS THAN 0.1 CM FROM SUPERIOR MARGIN. - FIBROCYSTIC CHANGES. - PREVIOUS BIOPSY SITE. - NO EVIDENCE OF INVASIVE CARCINOMA.   Diagnosis 06/11/16 1. Breast, right, needle core biopsy, 9:30 o'clock - LOBULAR NEOPLASIA (ATYPICAL LOBULAR HYPERPLASIA) INVOLVING A FIBROADENOMA. - SEE COMMENT. 2.  Breast, right, needle core biopsy, 10:00 o'clock - LOBULAR NEOPLASIA (ATYPICAL LOBULAR HYPERPLASIA). - FIBROCYSTIC CHANGES. - SEE COMMENT. Microscopic Comment 1. , 2. The results were called to the [facility] on 06/12/2016. (JBK:kh 06/12/16)   RADIOGRAPHIC STUDIES: I have personally reviewed the radiological images as listed and agreed with the findings in the report. No results found.   [place] of Right Breast 06/11/16 IMPRESSION: 1. There is an indeterminate mass in the right breast at 10 o'clock, 8 cm from the nipple. 2. There is an indeterminate mass in the right breast at 10 o'clock, 9 cm from the nipple, however it is favored to represent a cluster of cysts. 3.  No evidence of right axillary lymphadenopathy.  MM screening of Bilateral Breast 06/06/16  IMPRESSION: Further evaluation is suggested for possible mass in the right breast. RECOMMENDATION: Diagnostic mammogram and possibly ultrasound of the right breast.  ASSESSMENT & PLAN:  [name] S Madrid is a 66 y.o. caucasian female who has a history of arthitis, melanoma, chronic anxiety, HLD, HTN, and Obesity. She is here to follow up post lumpectomy for her Atypical lobular hyperplasia of the right breast.   1 Right breast atypical lobular hyperplasia -Mass found from a mammogram screening on 06/06/16. She had a lumpectomy on 07/10/16 by Dr. Marlou Starks.  -I discussed her imaging findings and surgical pathology results with her in great details. -We reviewed the natural history of atypical hyperplasia. It is consider a benign breast disease, however it does increase the risk of breast cancer by 3-5 fold. It is considered as a high risk for breast cancer. -We discussed annual screening mammogram, which will detect early stage breast cancer. She agrees to continue. She is very compliant on screening.  -We also discussed additional screening towards for breast cancer , such as MRI. She has persistent density category C, with  a high risk for breast cancer, it is reasonable to have bilateral breast MRI with and without contrast every 1-2 years.  -We also discussed healthy diet and regular exercise, calcium and vitamin D supplement, to reduce her risk of breast cancer -We further discussed chemoprevention to breast cancer. I discussed the option of tamoxifen, raloxifene and anastrozole. These endocrine therapy agent is likely  going to reduce the risk of breast cancer by 30-40%, however there is no data of survival benefit so far.  -The different side effects of this 3 agents were discussed with patient in great details. Written material were provided to her.  -We discussed that she does not need a mastectomy.  -She has had a hysterectomy in the past, tamoxifen may be a better option for her. -She has one family member who had breast cancer, and other family member had pancreatic cancer. Although she does not meet criteria for genetic testing, she can consider genetic testing. She is interested, I will send referral to genetics -We discussed her risks based on if she did or did not use chemoprevention. I mentioned she can try Tamoxifen to see how it works for her. If she does not want to do chemoprevention she can decide to f/u with exam and mammogram/MRI with PCP or me. -The patient will wait after genetic testing and think about Tamoxifen, she is concerned about side effects, especially hot flash and mood swing.  -She is on Prozac, which needs to be changed if she decides to take tamoxifen, due to the interaction with tamoxifen.  -F/u open   2 Bone health -Her last DEXA scan was 08/14/14 which was normal  -She is on vitamins D and calcium supplement -I discussed tamoxifen and raloxifene can increased her bone density.  3. Genetic Testing  -Will send referral to our genetic counselor    PLAN: -send referal to genetics -F/u open  -pt will call me if she wants to try Tamoxifen.    No orders of the defined types were  placed in this encounter.    patient and her husband had many questions, which were all answered. The patient knows to call the clinic with any problems, questions or concerns. I spent 55 minutes counseling the patient face to face. The total time spent in the appointment was 60 minutes and more than 50% was on counseling.  This document serves as a record of services personally performed by Truitt Merle, MD. It was created on her behalf by Joslyn Devon, a trained medical scribe. The creation of this record is based on the scribe's personal observations and the provider's statements to them. This document has been checked and approved by the attending provider.     Truitt Merle, MD 10/01/2016

## 2016-10-01 ENCOUNTER — Ambulatory Visit (HOSPITAL_BASED_OUTPATIENT_CLINIC_OR_DEPARTMENT_OTHER): Payer: 59 | Admitting: Hematology

## 2016-10-01 ENCOUNTER — Encounter (INDEPENDENT_AMBULATORY_CARE_PROVIDER_SITE_OTHER): Payer: Self-pay

## 2016-10-01 ENCOUNTER — Encounter: Payer: Self-pay | Admitting: Hematology

## 2016-10-01 ENCOUNTER — Telehealth: Payer: Self-pay | Admitting: Hematology

## 2016-10-01 DIAGNOSIS — Z8507 Personal history of malignant neoplasm of pancreas: Secondary | ICD-10-CM

## 2016-10-01 DIAGNOSIS — N6091 Unspecified benign mammary dysplasia of right breast: Secondary | ICD-10-CM | POA: Diagnosis not present

## 2016-10-01 DIAGNOSIS — Z803 Family history of malignant neoplasm of breast: Secondary | ICD-10-CM

## 2016-10-01 NOTE — Telephone Encounter (Signed)
Gave patient AVS and calender per 5/23 LOS . Genetics scheduled per LOS

## 2016-10-02 ENCOUNTER — Encounter: Payer: Self-pay | Admitting: Hematology

## 2016-10-02 DIAGNOSIS — N6091 Unspecified benign mammary dysplasia of right breast: Secondary | ICD-10-CM | POA: Insufficient documentation

## 2016-10-07 ENCOUNTER — Other Ambulatory Visit: Payer: Self-pay | Admitting: Gastroenterology

## 2016-10-07 DIAGNOSIS — R1011 Right upper quadrant pain: Secondary | ICD-10-CM

## 2016-10-08 ENCOUNTER — Encounter (HOSPITAL_COMMUNITY): Payer: Self-pay

## 2016-10-10 NOTE — Addendum Note (Signed)
Addendum  created 10/10/16 1053 by Effie Berkshire, MD   Sign clinical note

## 2016-10-14 ENCOUNTER — Encounter (HOSPITAL_COMMUNITY)
Admission: RE | Admit: 2016-10-14 | Discharge: 2016-10-14 | Disposition: A | Discharge status: Home / Self Care | Payer: 59 | Source: Ambulatory Visit | Attending: Gastroenterology | Admitting: Gastroenterology

## 2016-10-14 DIAGNOSIS — R1011 Right upper quadrant pain: Secondary | ICD-10-CM | POA: Diagnosis present

## 2016-10-14 MED ORDER — TECHNETIUM TC 99M MEBROFENIN IV KIT: 5.2000 | PACK | Freq: Once | INTRAVENOUS | Status: DC | PRN

## 2016-10-15 ENCOUNTER — Ambulatory Visit (HOSPITAL_BASED_OUTPATIENT_CLINIC_OR_DEPARTMENT_OTHER): Payer: 59 | Admitting: Genetic Counselor

## 2016-10-15 ENCOUNTER — Encounter: Payer: Self-pay | Admitting: Genetic Counselor

## 2016-10-15 ENCOUNTER — Other Ambulatory Visit: Payer: 59

## 2016-10-15 DIAGNOSIS — Z803 Family history of malignant neoplasm of breast: Secondary | ICD-10-CM | POA: Diagnosis not present

## 2016-10-15 DIAGNOSIS — Z800 Family history of malignant neoplasm of digestive organs: Secondary | ICD-10-CM

## 2016-10-15 DIAGNOSIS — Z808 Family history of malignant neoplasm of other organs or systems: Secondary | ICD-10-CM

## 2016-10-15 DIAGNOSIS — Z85820 Personal history of malignant melanoma of skin: Secondary | ICD-10-CM

## 2016-10-15 DIAGNOSIS — Z315 Encounter for genetic counseling: Secondary | ICD-10-CM

## 2016-10-15 NOTE — Progress Notes (Signed)
REFERRING PROVIDER: Truitt Merle, MD [place], [place] [address]  PRIMARY PROVIDER:  Aura Dials, PA-C  PRIMARY REASON FOR VISIT:  1. Personal history of malignant melanoma   2. Family history of melanoma   3. Family history of pancreatic cancer   4. Family history of breast cancer      HISTORY OF PRESENT ILLNESS:   [name] [name], a 66 y.o. female, was seen for a [place] cancer genetics consultation at the request of Dr. Burr Medico due to a personal and family history of cancer.  [name] [name] presents to clinic today to discuss the possibility of a hereditary predisposition to cancer, genetic testing, and to further clarify her future cancer risks, as well as potential cancer risks for family members.   In the last 10 years, at the age of 60-59, [name] [name] was diagnosed with malignant melanoma on her arm, buttock and top of foot. This was treated with removal of the melanoma.  Recently she was found to have two breast lumps.  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      CANCER HISTORY:   No history exists.     HORMONAL RISK FACTORS:  Menarche was at age 31.  First live birth at age 89.  OCP use for approximately 5 years.  Ovaries intact: no.  Hysterectomy: yes.  Menopausal status: postmenopausal.  HRT use: 1-2 years. Colonoscopy: yes; normal. Mammogram within the last year: yes. Number of breast biopsies: 3. Up to date with pelvic exams:  yes. Any excessive radiation exposure in the past:  no  Past Medical History:  Diagnosis Date  . Arthritis    feet,knees,hands  . Cancer ([employer])    melanoma  . Chronic anxiety   . Family history of breast cancer   . Family history of melanoma   . Family history of pancreatic cancer   . Hyperlipidemia   . Hypertension   . Obesity   . Personal history of malignant melanoma     Past Surgical History:  Procedure Laterality Date  . ABDOMINAL HYSTERECTOMY     . APPENDECTOMY  1978  . BREAST LUMPECTOMY WITH RADIOACTIVE SEED LOCALIZATION Right 07/10/2016   Procedure: RIGHT BREAST LUMPECTOMY WITH RADIOACTIVE SEED LOCALIZATION;  Surgeon: Autumn Messing III, MD;  Location: [facility];  Service: General;  Laterality: Right;  . GASTRIC ROUX-EN-Y  03/09/2012   Procedure: LAPAROSCOPIC ROUX-EN-Y GASTRIC;  Surgeon: Gayland Curry, MD,FACS;  Location[facility];  Service: General;  Laterality: N/A;  . low back  2010   x3  . right ureter surgery     in college  . UPPER GI ENDOSCOPY  03/09/2012   Procedure: UPPER GI ENDOSCOPY;  Surgeon: Gayland Curry, MD,FACS;  [facility];  Service: General;  Laterality: N/A;    Social History   Social History  . Marital status: Married    Spouse name: N/A  . Number of children: 2  . Years of education: N/A   Occupational History  . retired Pharmacist, hospital    Social History Main Topics  . Smoking status: Never Smoker  . Smokeless tobacco: Never Used  . Alcohol use 0.6 - 1.2 oz/week    1 - 2 Glasses of wine per week     Comment: 1-2 glass winn daily   . Drug use: No  . Sexual activity: Not Asked   Other Topics Concern  . None   Social History Narrative  .  None     FAMILY HISTORY:  We obtained a detailed, 4-generation family history.  Significant diagnoses are listed below: Family History  Problem Relation Age of Onset  . Lung cancer Mother   . Cancer Mother        lung  . Heart disease Father   . HIV Brother   . Cancer Paternal Aunt 82       pancreatic cancer   . Cancer Maternal Grandmother        liver cancer   . Cancer Paternal Aunt 59       breast cancer  . Heart attack Maternal Grandfather   . Congestive Heart Failure Paternal Grandmother   . Stroke Paternal Grandfather     The patient has a son and daughter.  Her son was diagnosed with a melanoma and basel cell carcinoma on his scalp.  She has two brothers who are cancer free.  Her mother died of lung cancer at 44.  She had one brother  who did not have cancer.  The patient's maternal grandmother had liver cancer and her grandfather died of a heart attack.  The patient's father died of heart problems and COPD at age 40.  He had seven sisters.  One sister had breast cancer in her 53's and another sister had pancreatic cancer.  There is no other reported family history of cancer.  [name] [name] is unaware of previous family history of genetic testing for hereditary cancer risks. Patient's maternal ancestors are of [place] descent, and paternal ancestors are of Caucasian descent. There is no reported Ashkenazi Jewish ancestry. There is no known consanguinity.  GENETIC COUNSELING ASSESSMENT: [name] [name] is a 66 y.o. female with a personal and family history of cancer which is somewhat suggestive of a hereditary cancer syndrome and predisposition to cancer. We, therefore, discussed and recommended the following at today's visit.   DISCUSSION: We discussed that about 5-10% of melanoma is hereditary with most cases due to mutations within the CDKN2A gene.  This is associated with an increased risk for melanoma and pancreatic cancer.  There are other genes associated with melanoma, some are melanoma dominant syndromes where melanoma is the primary cancer.  Others are melanoma subordinate cancer syndromes, where melanoma may be one of many cancers seen in the condition. We discussed that criteria for melanoma testing include either multiple cases of melanoma, either in one person or the family, and a family history of pancreatic cancer, and/or a family history of other common cancers including breast, prostate or colon.  We reviewed the characteristics, features and inheritance patterns of hereditary cancer syndromes. We also discussed genetic testing, including the appropriate family members to test, the process of testing, insurance coverage and turn-around-time for results. We discussed the implications of a negative, positive and/or variant  of uncertain significant result. We recommended [name] [name] pursue genetic testing for the [facility] panel. The Multi-Gene Panel offered by [facility] includes sequencing and/or deletion duplication testing of the following 80 genes: ALK, APC, ATM, AXIN2,BAP1,  BARD1, BLM, BMPR1A, BRCA1, BRCA2, BRIP1, CASR, CDC73, CDH1, CDK4, CDKN1B, CDKN1C, CDKN2A (p14ARF), CDKN2A (p16INK4a), CEBPA, CHEK2, CTNNA1, DICER1, DIS3L2, EGFR (c.2369C>T, p.Thr790Met variant only), EPCAM (Deletion/duplication testing only), FH, FLCN, GATA2, GPC3, GREM1 (Promoter region deletion/duplication testing only), HOXB13 (c.251G>A, p.Gly84Glu), HRAS, KIT, MAX, MEN1, MET, MITF (c.952G>A, p.Glu318Lys variant only), MLH1, MSH2, MSH3, MSH6, MUTYH, NBN, NF1, NF2, NTHL1, PALB2, PDGFRA, PHOX2B, PMS2, POLD1, POLE, POT1, PRKAR1A, PTCH1, PTEN, RAD50, RAD51C, RAD51D, RB1, RECQL4, RET, RUNX1, SDHAF2, SDHA (sequence changes  only), SDHB, SDHC, SDHD, SMAD4, SMARCA4, SMARCB1, SMARCE1, STK11, SUFU, TERT, TERT, TMEM127, TP53, TSC1, TSC2, VHL, WRN and WT1.    Based on [name] [name]'s personal and family history of cancer, she meets medical criteria for genetic testing. Despite that she meets criteria, she may still have an out of pocket cost. We discussed that if her out of pocket cost for testing is over $100, the laboratory will call and confirm whether she wants to proceed with testing.  If the out of pocket cost of testing is less than $100 she will be billed by the genetic testing laboratory.   PLAN: After considering the risks, benefits, and limitations, [name] [name]  provided informed consent to pursue genetic testing and the blood sample was sent to [facility] for analysis of the Multi-gene panel. Results should be available within approximately 2-3 weeks' time, at which point they will be disclosed by telephone to [name] [name], as will any additional recommendations warranted by these results. [name] [name] will receive a summary of her genetic  counseling visit and a copy of her results once available. This information will also be available in Epic. We encouraged [name] [name] to remain in contact with cancer genetics annually so that we can continuously update the family history and inform her of any changes in cancer genetics and testing that may be of benefit for her family. [name] [name] questions were answered to her satisfaction today. Our contact information was provided should additional questions or concerns arise.  Lastly, we encouraged [name] [name] to remain in contact with cancer genetics annually so that we can continuously update the family history and inform her of any changes in cancer genetics and testing that may be of benefit for this family.   Ms.  Nase questions were answered to her satisfaction today. Our contact information was provided should additional questions or concerns arise. Thank you for the referral and allowing [place] to share in the care of your patient.   [name] [name] P. Florene [name], [place], [facility] Certified Genetic Counselor [email or web address] phone: [phone]  The patient was seen for a total of 45 minutes in face-to-face genetic counseling.  This patient was discussed with Drs. Magrinat, Lindi Adie and/or Burr Medico who agrees with the above.    _______________________________________________________________________ For Office Staff:  Number of people involved in session: 1 Was an Intern/ student involved with case: no

## 2016-11-07 ENCOUNTER — Telehealth: Payer: Self-pay | Admitting: Genetic Counselor

## 2016-11-07 ENCOUNTER — Encounter: Payer: Self-pay | Admitting: Genetic Counselor

## 2016-11-07 ENCOUNTER — Ambulatory Visit: Payer: Self-pay | Admitting: Genetic Counselor

## 2016-11-07 DIAGNOSIS — Z803 Family history of malignant neoplasm of breast: Secondary | ICD-10-CM

## 2016-11-07 DIAGNOSIS — Z1379 Encounter for other screening for genetic and chromosomal anomalies: Secondary | ICD-10-CM

## 2016-11-07 DIAGNOSIS — Z808 Family history of malignant neoplasm of other organs or systems: Secondary | ICD-10-CM

## 2016-11-07 DIAGNOSIS — Z800 Family history of malignant neoplasm of digestive organs: Secondary | ICD-10-CM

## 2016-11-07 DIAGNOSIS — Z85820 Personal history of malignant melanoma of skin: Secondary | ICD-10-CM

## 2016-11-07 DIAGNOSIS — N6091 Unspecified benign mammary dysplasia of right breast: Secondary | ICD-10-CM

## 2016-11-07 NOTE — Telephone Encounter (Signed)
Revealed negative genetic testing.  Discussed that we do not know why she has breast cancer and developed melanoma or why there is cancer in the family. It could be due to a different gene that we are not testing, or maybe our current technology may not be able to pick something up.  It will be important for her to keep in contact with genetics to keep up with whether additional testing may be needed.

## 2016-11-07 NOTE — Progress Notes (Signed)
HPI: [name] [name] was previously seen in the [place] clinic due to a personal and family history of cancer and concerns regarding a hereditary predisposition to cancer. Please refer to our prior cancer genetics clinic note for more information regarding [name] [name]'s medical, social and family histories, and our assessment and recommendations, at the time. [name] [name] recent genetic test results were disclosed to her, as were recommendations warranted by these results. These results and recommendations are discussed in more detail below.  CANCER HISTORY:   No history exists.    FAMILY HISTORY:  We obtained a detailed, 4-generation family history.  Significant diagnoses are listed below: Family History  Problem Relation Age of Onset  . Lung cancer Mother   . Cancer Mother        lung  . Heart disease Father   . HIV Brother   . Cancer Paternal Aunt 13       pancreatic cancer   . Cancer Maternal Grandmother        liver cancer   . Cancer Paternal Aunt 75       breast cancer  . Heart attack Maternal Grandfather   . Congestive Heart Failure Paternal Grandmother   . Stroke Paternal Grandfather   . Melanoma Son 85    The patient has a son and daughter.  Her son was diagnosed with a melanoma and basel cell carcinoma on his scalp.  She has two brothers who are cancer free.  Her mother died of lung cancer at 39.  She had one brother who did not have cancer.  The patient's maternal grandmother had liver cancer and her grandfather died of a heart attack.  The patient's father died of heart problems and COPD at age 70.  He had seven sisters.  One sister had breast cancer in her 4's and another sister had pancreatic cancer.  There is no other reported family history of cancer.  [name] [name] is unaware of previous family history of genetic testing for hereditary cancer risks. Patient's maternal ancestors are of [place] descent, and paternal ancestors are of Caucasian descent.  There is no reported Ashkenazi Jewish ancestry. There is no known consanguinity.  GENETIC TEST RESULTS: Genetic testing reported out on November 06, 2016 through the Multi-gene cancer panel found no deleterious mutations.  The Multi-Gene Panel offered by [facility] includes sequencing and/or deletion duplication testing of the following 80 genes: ALK, APC, ATM, AXIN2,BAP1,  BARD1, BLM, BMPR1A, BRCA1, BRCA2, BRIP1, CASR, CDC73, CDH1, CDK4, CDKN1B, CDKN1C, CDKN2A (p14ARF), CDKN2A (p16INK4a), CEBPA, CHEK2, CTNNA1, DICER1, DIS3L2, EGFR (c.2369C>T, p.Thr790Met variant only), EPCAM (Deletion/duplication testing only), FH, FLCN, GATA2, GPC3, GREM1 (Promoter region deletion/duplication testing only), HOXB13 (c.251G>A, p.Gly84Glu), HRAS, KIT, MAX, MEN1, MET, MITF (c.952G>A, p.Glu318Lys variant only), MLH1, MSH2, MSH3, MSH6, MUTYH, NBN, NF1, NF2, NTHL1, PALB2, PDGFRA, PHOX2B, PMS2, POLD1, POLE, POT1, PRKAR1A, PTCH1, PTEN, RAD50, RAD51C, RAD51D, RB1, RECQL4, RET, RUNX1, SDHAF2, SDHA (sequence changes only), SDHB, SDHC, SDHD, SMAD4, SMARCA4, SMARCB1, SMARCE1, STK11, SUFU, TERT, TERT, TMEM127, TP53, TSC1, TSC2, VHL, WRN and WT1.  The test report has been scanned into EPIC and is located under the Molecular Pathology section of the Results Review tab.   We discussed with [name] [name] that since the current genetic testing is not perfect, it is possible there may be a gene mutation in one of these genes that current testing cannot detect, but that chance is small. We also discussed, that it is possible that another gene that has not yet been discovered, or  that we have not yet tested, is responsible for the cancer diagnoses in the family, and it is, therefore, important to remain in touch with cancer genetics in the future so that we can continue to offer [name] [name] the most up to date genetic testing.       ADDITIONAL GENETIC TESTING: We discussed with [name] [name] that there are other genes that are associated with increased  cancer risk that can be analyzed. The laboratories that offer such testing look at these additional genes via a hereditary cancer gene panel. Should [name] [name] wish to pursue additional genetic testing, we are happy to discuss and coordinate this testing, at any time.    CANCER SCREENING RECOMMENDATIONS:  This result is reassuring and indicates that [name] [name] likely does not have an increased risk for a future cancer due to a mutation in one of these genes. This normal test also suggests that [name] [name]'s cancer was most likely not due to an inherited predisposition associated with one of these genes.  Most cancers happen by chance and this negative test suggests that her cancer falls into this category.  We, therefore, recommended she continue to follow the cancer management and screening guidelines provided by her oncology and primary healthcare provider.   RECOMMENDATIONS FOR FAMILY MEMBERS: Women in this family might be at some increased risk of developing cancer, over the general population risk, simply due to the family history of cancer. We recommended women in this family have a yearly mammogram beginning at age 46, or 6 years younger than the earliest onset of cancer, an annual clinical breast exam, and perform monthly breast self-exams. Women in this family should also have a gynecological exam as recommended by their primary provider. All family members should have a colonoscopy by age 32.  FOLLOW-UP: Lastly, we discussed with [name] [name] that cancer genetics is a rapidly advancing field and it is possible that new genetic tests will be appropriate for her and/or her family members in the future. We encouraged her to remain in contact with cancer genetics on an annual basis so we can update her personal and family histories and let her know of advances in cancer genetics that may benefit this family.   Our contact number was provided. [name] [name] questions were answered to her  satisfaction, and she knows she is welcome to call us at anytime with additional questions or concerns.   Roma Kayser, MS, [facility] Certified Genetic Counselor [email or web address]

## 2016-11-07 NOTE — Telephone Encounter (Signed)
LM on VM with good news.  Asked that she CB today before 3 PM, and left Brenda's phone number for next week so she could get ahold of someone.

## 2017-07-07 ENCOUNTER — Other Ambulatory Visit: Payer: Self-pay | Admitting: Physician Assistant

## 2017-07-07 DIAGNOSIS — E2839 Other primary ovarian failure: Secondary | ICD-10-CM

## 2017-07-07 DIAGNOSIS — Z139 Encounter for screening, unspecified: Secondary | ICD-10-CM

## 2017-07-09 ENCOUNTER — Other Ambulatory Visit: Payer: Self-pay | Admitting: Nurse Practitioner

## 2017-07-09 DIAGNOSIS — E2839 Other primary ovarian failure: Secondary | ICD-10-CM

## 2017-09-15 ENCOUNTER — Ambulatory Visit
Admission: RE | Admit: 2017-09-15 | Discharge: 2017-09-15 | Disposition: A | Discharge status: Home / Self Care | Payer: Medicare Other | Source: Ambulatory Visit | Attending: Physician Assistant | Admitting: Physician Assistant

## 2017-09-15 DIAGNOSIS — E2839 Other primary ovarian failure: Secondary | ICD-10-CM

## 2017-09-15 DIAGNOSIS — Z139 Encounter for screening, unspecified: Secondary | ICD-10-CM

## 2018-08-09 ENCOUNTER — Other Ambulatory Visit: Payer: Self-pay | Admitting: Physician Assistant

## 2018-08-09 DIAGNOSIS — Z1231 Encounter for screening mammogram for malignant neoplasm of breast: Secondary | ICD-10-CM

## 2018-09-16 ENCOUNTER — Telehealth: Payer: Self-pay | Admitting: Cardiology

## 2018-09-16 NOTE — Telephone Encounter (Signed)
LVM to let us know video or phone visit.

## 2018-09-17 ENCOUNTER — Telehealth: Payer: Self-pay | Admitting: Cardiology

## 2018-09-17 NOTE — Telephone Encounter (Signed)
LVM for patient to call back and schedule with Eulas Post or be put on cancellation list with Dr. Martinique.

## 2018-09-20 ENCOUNTER — Telehealth: Payer: Medicare Other | Admitting: Cardiology

## 2018-10-15 ENCOUNTER — Ambulatory Visit: Payer: Medicare Other

## 2018-10-18 ENCOUNTER — Other Ambulatory Visit: Payer: Self-pay

## 2018-10-18 ENCOUNTER — Ambulatory Visit
Admission: RE | Admit: 2018-10-18 | Discharge: 2018-10-18 | Disposition: A | Discharge status: Home / Self Care | Payer: Medicare Other | Source: Ambulatory Visit | Attending: Physician Assistant | Admitting: Physician Assistant

## 2018-10-18 DIAGNOSIS — Z1231 Encounter for screening mammogram for malignant neoplasm of breast: Secondary | ICD-10-CM

## 2018-11-11 ENCOUNTER — Telehealth: Payer: Self-pay | Admitting: Cardiology

## 2018-11-11 NOTE — Telephone Encounter (Signed)
LVM, reminding pt of her appt on 11-15-18.

## 2018-11-12 NOTE — Progress Notes (Signed)
Cardiology Office Note   Date:  11/15/2018   ID:  [name] S Schult, DOB 05-07-1951, MRN [id number]  PCP:  Aura Dials, PA-C  Cardiologist:   Peter Martinique, MD   Chief Complaint  Patient presents with  . New Patient (Initial Visit)  . Hypertension      History of Present Illness:  [name] [name] is a 68 y.o. female who is seen at the request of [name] PA-C for evaluation of elevated BP. 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 She has a history of HLD. I last saw her in 2013. I had cared for her father Christie Nottingham for a number of years. She was evaluated in November of 2011 with a stress Cardiolite study which was normal. She also had an echocardiogram which demonstrated mild LVH and mild biatrial enlargement but was otherwise normal. She lives much of the year in the [place].   She wanted to get reestablished for cardiac care. She notes she lost 100 lbs with her gastric bypass. She felt much better and was able initially to come off BP medication. Since then BP has increased and she is on Ziac. BP is still staying high in 155 range. Cholesterol is markedly improved. She denies any chest pain, SOB, diaphoresis. No edema.   Past Medical History:  Diagnosis Date  . Arthritis    feet,knees,hands  . Cancer ([place])    melanoma  . Chronic anxiety   . Family history of breast cancer   . Family history of melanoma   . Family history of pancreatic cancer   . Hyperlipidemia   . Hypertension   . Obesity   . Personal history of malignant melanoma     Past Surgical History:  Procedure Laterality Date  . ABDOMINAL HYSTERECTOMY    . APPENDECTOMY  1978  . BREAST EXCISIONAL BIOPSY Right   . BREAST LUMPECTOMY WITH RADIOACTIVE SEED LOCALIZATION Right 07/10/2016   Procedure: RIGHT BREAST LUMPECTOMY WITH RADIOACTIVE SEED LOCALIZATION;  Surgeon: Autumn Messing III, MD;  Location: [facility];  Service: General;  Laterality: Right;  . GASTRIC  ROUX-EN-Y  03/09/2012   Procedure: LAPAROSCOPIC ROUX-EN-Y GASTRIC;  Surgeon: Gayland Curry, MD,FACS;  [facility];  Service: General;  Laterality: N/A;  . low back  2010   x3  . right ureter surgery     in college  . UPPER GI ENDOSCOPY  03/09/2012   Procedure: UPPER GI ENDOSCOPY;  Surgeon: Gayland Curry, MD,FACS;  [facility];  Service: General;  Laterality: N/A;     Current Outpatient Medications  Medication Sig Dispense Refill  . ASPIRIN 81 PO Take by mouth daily.    Marland Kitchen atorvastatin (LIPITOR) 10 MG tablet Take 10 mg by mouth daily.    . bisoprolol-hydrochlorothiazide (ZIAC) 5-6.25 MG per tablet Take 1 tablet by mouth daily.    Marland Kitchen DICLOFENAC PO Take 2 tablets by mouth daily.    Marland Kitchen FLUoxetine (PROZAC) 20 MG capsule Take 20 mg by mouth daily.    Marland Kitchen HYDROcodone-acetaminophen (NORCO/VICODIN) 5-325 MG tablet Take 1-2 tablets by mouth every 4 (four) hours as needed for moderate pain or severe pain. 20 tablet 0  . MAGNESIUM PO Take by mouth daily.    Marland Kitchen POTASSIUM PO Take by mouth daily.    Marland Kitchen zolpidem (AMBIEN) 5 MG tablet Take 5 mg by mouth at bedtime as needed for sleep.     No current facility-administered medications for this visit.  Allergies:   Patient has no known allergies.    Social History:  The patient  reports that she has never smoked. She has never used smokeless tobacco. She reports current alcohol use of about 1.0 - 2.0 standard drinks of alcohol per week. She reports that she does not use drugs.   Family History:  The patient's family history includes Breast cancer in her paternal aunt; Cancer in her maternal grandmother and mother; Cancer (age of onset: 1) in her paternal aunt and paternal aunt; Congestive Heart Failure in her paternal grandmother; HIV in her brother; Heart attack in her maternal grandfather; Heart disease in her father; Lung cancer in her mother; Melanoma (age of onset: 17) in her son; Stroke in her paternal grandfather.    ROS:  Please see the  history of present illness.   Otherwise, review of systems are positive for none.   All other systems are reviewed and negative.    PHYSICAL EXAM: VS:  BP (!) 159/86   Pulse 68   Ht 5\' 2"  (1.575 m)   Wt 153 lb 6.4 oz (69.6 kg)   BMI 28.06 kg/m  , BMI Body mass index is 28.06 kg/m. GEN: Well nourished, well developed, in no acute distress  HEENT: normal  Neck: no JVD, carotid bruits, or masses Cardiac: RRR; no murmurs, rubs, or gallops,no edema  Respiratory:  clear to auscultation bilaterally, normal work of breathing GI: soft, nontender, nondistended, + BS MS: no deformity or atrophy  Skin: warm and dry, no rash Neuro:  Strength and sensation are intact Psych: euthymic mood, full affect   EKG:  EKG is ordered today. The ekg ordered today demonstrates NSR with normal Ecg. I have personally reviewed and interpreted this study.    Recent Labs: No results found for requested labs within last 8760 hours.    Lipid Panel    Component Value Date/Time   CHOL 242 (H) 07/29/2012 1121   TRIG 87 07/29/2012 1121   HDL 54 07/29/2012 1121   CHOLHDL 4.5 07/29/2012 1121   VLDL 17 07/29/2012 1121   LDLCALC 171 (H) 07/29/2012 1121    Labs dated 10/15/18: cholesterol 216, triglycerides 62, HDL 125, LDL 79. CMET normal.    Wt Readings from Last 3 Encounters:  11/15/18 153 lb 6.4 oz (69.6 kg)  10/01/16 168 lb 6.4 oz (76.4 kg)  07/10/16 172 lb (78 kg)      Other studies Reviewed: Additional studies/ records that were reviewed today include: none    ASSESSMENT AND PLAN:  1.  HTN. Still not well controlled on Ziac. Will add losartan 25 mg daily. She is scheduled to see primary care in August. Can titrate further if BP still not controlled. 2. Hypercholesterolemia. Well controlled on lipitor. 3. Morbid obesity. Excellent response to gastric bypasss.    Current medicines are reviewed at length with the patient today.  The patient does not have concerns regarding medicines.  The  following changes have been made:  See above  Labs/ tests ordered today include: none No orders of the defined types were placed in this encounter.    Disposition:   FU with me in 1 year  Signed, Peter Martinique, MD  11/15/2018 10:44 AM    [facility] [address] [place], [place], [address] Phone [phone], Fax [phone]

## 2018-11-15 ENCOUNTER — Other Ambulatory Visit: Payer: Self-pay

## 2018-11-15 ENCOUNTER — Encounter: Payer: Self-pay | Admitting: Cardiology

## 2018-11-15 ENCOUNTER — Ambulatory Visit (INDEPENDENT_AMBULATORY_CARE_PROVIDER_SITE_OTHER): Payer: Medicare Other | Admitting: Cardiology

## 2018-11-15 VITALS — BP 159/86 | HR 68 | Ht 62.0 in | Wt 153.4 lb

## 2018-11-15 DIAGNOSIS — I10 Essential (primary) hypertension: Secondary | ICD-10-CM | POA: Diagnosis not present

## 2018-11-15 MED ORDER — LOSARTAN POTASSIUM 25 MG PO TABS: 25.0000 mg | ORAL_TABLET | Freq: Every day | ORAL | 3 refills | Status: DC

## 2018-11-15 NOTE — Patient Instructions (Signed)
Add losartan 25 mg daily for blood pressure  Follow up in one year

## 2019-01-13 ENCOUNTER — Telehealth: Payer: Self-pay | Admitting: Hematology

## 2019-01-13 NOTE — Telephone Encounter (Signed)
Called pt per 9/03 sch message - unable to reach pt . Left message for pt to call back if patient wanted to set up an appt to see Dr. Burr Medico

## 2019-08-10 ENCOUNTER — Other Ambulatory Visit: Payer: Self-pay

## 2019-08-10 MED ORDER — LOSARTAN POTASSIUM 25 MG PO TABS: 25.0000 mg | ORAL_TABLET | Freq: Every day | ORAL | 0 refills | Status: DC

## 2019-10-28 ENCOUNTER — Other Ambulatory Visit: Payer: Self-pay

## 2019-10-31 ENCOUNTER — Other Ambulatory Visit: Payer: Self-pay

## 2019-10-31 MED ORDER — LOSARTAN POTASSIUM 25 MG PO TABS: 25.0000 mg | ORAL_TABLET | Freq: Every day | ORAL | 3 refills | Status: DC

## 2019-12-27 ENCOUNTER — Ambulatory Visit: Payer: Medicare Other | Admitting: Cardiology

## 2020-01-06 NOTE — Progress Notes (Signed)
Cardiology Office Note   Date:  01/09/2020   ID:  [name] [name], DOB 05/27/1950, MRN [id number]  PCP:  Aura Dials, PA-C  Cardiologist:   Yaziel Brandon Martinique, MD   Chief Complaint  Patient presents with  . Hypertension      History of Present Illness:  [name] [name] is a 69 y.o. female who is seen for follow up HTN.  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 She has a history of HLD. I last saw her in 2013. I had cared for her father Christie Nottingham for a number of years. She was evaluated in November of 2011 with a stress Cardiolite study which was normal. She also had an echocardiogram which demonstrated mild LVH and mild biatrial enlargement but was otherwise normal. She lives much of the year in the [place].   She notes she lost 100 lbs with her gastric bypass. She felt much better and was able initially to come off BP medication. Since then BP had increased and she was started on Ziac. BP was still high so we added losartan. Now she is on HCTZ instead of Ziac. BP at home typically 130/70.  Cholesterol is markedly improved. She denies any chest pain, SOB, diaphoresis. No edema. Tolerating medication well.    Past Medical History:  Diagnosis Date  . Arthritis    feet,knees,hands  . Cancer ([place])    melanoma  . Chronic anxiety   . Family history of breast cancer   . Family history of melanoma   . Family history of pancreatic cancer   . Hyperlipidemia   . Hypertension   . Obesity   . Personal history of malignant melanoma     Past Surgical History:  Procedure Laterality Date  . ABDOMINAL HYSTERECTOMY    . APPENDECTOMY  1978  . BREAST EXCISIONAL BIOPSY Right   . BREAST LUMPECTOMY WITH RADIOACTIVE SEED LOCALIZATION Right 07/10/2016   Procedure: RIGHT BREAST LUMPECTOMY WITH RADIOACTIVE SEED LOCALIZATION;  Surgeon: Autumn Messing III, MD;  Location: [facility];  Service: General;  Laterality: Right;  . GASTRIC ROUX-EN-Y  03/09/2012    Procedure: LAPAROSCOPIC ROUX-EN-Y GASTRIC;  Surgeon: Gayland Curry, MD,FACS;  Location: [facility];  Service: General;  Laterality: N/A;  . low back  2010   x3  . right ureter surgery     in college  . UPPER GI ENDOSCOPY  03/09/2012   Procedure: UPPER GI ENDOSCOPY;  Surgeon: Gayland Curry, MD,FACS;  Location: [facility];  Service: General;  Laterality: N/A;     Current Outpatient Medications  Medication Sig Dispense Refill  . Ascorbic Acid (VITAMIN C PO) Take by mouth.    . ASPIRIN 81 PO Take by mouth daily.    Marland Kitchen atorvastatin (LIPITOR) 10 MG tablet Take 10 mg by mouth daily.    . busPIRone (BUSPAR) 7.5 MG tablet Take 7.5 mg by mouth 3 (three) times daily.    . calcium-vitamin D (OSCAL WITH D) 500-200 MG-UNIT tablet Take 1 tablet by mouth daily.    Marland Kitchen DICLOFENAC PO Take 2 tablets by mouth daily.    . ferrous sulfate 325 (65 FE) MG tablet Take 325 mg by mouth in the morning and at bedtime.    Marland Kitchen FLUoxetine (PROZAC) 40 MG capsule Take 20 mg by mouth daily.     . fluticasone (FLONASE) 50 MCG/ACT nasal spray Place into both nostrils daily.    . hydrochlorothiazide (MICROZIDE) 12.5 MG capsule Take  12.5 mg by mouth daily.    Marland Kitchen losartan (COZAAR) 50 MG tablet Take 50 mg by mouth daily.    Marland Kitchen MAGNESIUM PO Take by mouth daily.    Marland Kitchen POTASSIUM PO Take by mouth daily.    Marland Kitchen zolpidem (AMBIEN) 5 MG tablet Take 5 mg by mouth at bedtime as needed for sleep.     No current facility-administered medications for this visit.    Allergies:   Codeine    Social History:  The patient  reports that she has never smoked. She has never used smokeless tobacco. She reports current alcohol use of about 1.0 - 2.0 standard drink of alcohol per week. She reports that she does not use drugs.   Family History:  The patient's family history includes Breast cancer in her paternal aunt; Cancer in her maternal grandmother and mother; Cancer (age of onset: 56) in her paternal aunt and paternal aunt; Congestive Heart Failure in her  paternal grandmother; HIV in her brother; Heart attack in her maternal grandfather; Heart disease in her father; Lung cancer in her mother; Melanoma (age of onset: 75) in her son; Stroke in her paternal grandfather.    ROS:  Please see the history of present illness.   Otherwise, review of systems are positive for none.   All other systems are reviewed and negative.    PHYSICAL EXAM: VS:  BP (!) 142/92   Pulse 62   Ht 5\' 1"  (1.549 m)   Wt 165 lb 3.2 oz (74.9 kg)   SpO2 99%   BMI 31.21 kg/m  , BMI Body mass index is 31.21 kg/m. GEN: Well nourished, well developed, in no acute distress  HEENT: normal  Neck: no JVD, carotid bruits, or masses Cardiac: RRR; no murmurs, rubs, or gallops,no edema  Respiratory:  clear to auscultation bilaterally, normal work of breathing GI: soft, nontender, nondistended, + BS MS: no deformity or atrophy  Skin: warm and dry, no rash Neuro:  Strength and sensation are intact Psych: euthymic mood, full affect   EKG:  EKG is ordered today. The ekg ordered today demonstrates NSR with normal Ecg. I have personally reviewed and interpreted this study.    Recent Labs: No results found for requested labs within last 8760 hours.    Lipid Panel    Component Value Date/Time   CHOL 242 (H) 07/29/2012 1121   TRIG 87 07/29/2012 1121   HDL 54 07/29/2012 1121   CHOLHDL 4.5 07/29/2012 1121   VLDL 17 07/29/2012 1121   LDLCALC 171 (H) 07/29/2012 1121    Labs dated 10/15/18: cholesterol 216, triglycerides 62, HDL 125, LDL 79. CMET normal.    Wt Readings from Last 3 Encounters:  01/09/20 165 lb 3.2 oz (74.9 kg)  11/15/18 153 lb 6.4 oz (69.6 kg)  10/01/16 168 lb 6.4 oz (76.4 kg)      Other studies Reviewed: Additional studies/ records that were reviewed today include: none    ASSESSMENT AND PLAN:  1.  HTN. Well controlled on combination of losartan and HCT.  She is scheduled to see primary care in August. Will have labs done then  2.  Hypercholesterolemia. Well controlled on lipitor. 3. Morbid obesity. Excellent response to gastric bypasss.    Current medicines are reviewed at length with the patient today.  The patient does not have concerns regarding medicines.  The following changes have been made:  See above  Labs/ tests ordered today include: none No orders of the defined types were placed in this  encounter.    Disposition:   FU with me in 1 year  Signed, Vergie Zahm Martinique, MD  01/09/2020 5:06 PM    [facility] [address], [place], [place], [address] Phone [phone], Fax [phone]

## 2020-01-09 ENCOUNTER — Ambulatory Visit (INDEPENDENT_AMBULATORY_CARE_PROVIDER_SITE_OTHER): Payer: Medicare PPO | Admitting: Cardiology

## 2020-01-09 ENCOUNTER — Encounter: Payer: Self-pay | Admitting: Cardiology

## 2020-01-09 ENCOUNTER — Other Ambulatory Visit: Payer: Self-pay

## 2020-01-09 VITALS — BP 142/92 | HR 62 | Ht 61.0 in | Wt 165.2 lb

## 2020-01-09 DIAGNOSIS — I10 Essential (primary) hypertension: Secondary | ICD-10-CM | POA: Diagnosis not present

## 2020-01-09 DIAGNOSIS — E7800 Pure hypercholesterolemia, unspecified: Secondary | ICD-10-CM

## 2020-12-31 NOTE — Progress Notes (Signed)
Cardiology Office Note   Date:  01/04/2021   ID:  [name] [name], DOB 06/04/1950, MRN [id number]  PCP:  Aura Dials, PA-C  Cardiologist:   Charlene Cowdrey Martinique, MD   Chief Complaint  Patient presents with   Hypertension       History of Present Illness:  [name] [name] is a 70 y.o. female who is seen for follow up HTN.  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 She has a history of HLD. I last saw her in 2013. I had cared for her father Christie Nottingham for a number of years. She was evaluated in November of 2011 with a stress Cardiolite study which was normal. She also had an echocardiogram which demonstrated mild LVH and mild biatrial enlargement but was otherwise normal. She lives much of the year in the [place].   She notes she lost 100 lbs with her gastric bypass. She felt much better and was able initially to come off BP medication. Since then BP had increased and she was started on Ziac. BP was still high so we added losartan. Now she is on HCTZ instead of Ziac. BP at home typically 130/70.  Cholesterol is markedly improved.   She was in the [place] last year in December and had an episode of acute memory loss. Lasted about 20 minutes. Went to the hospital. Was in NSR. MRI and MRA was negative. Echo was normal. She has followed up with Neuro here and recommended low dose ASA. No recurrent symptoms.    Past Medical History:  Diagnosis Date   Arthritis    feet,knees,hands   Cancer ([place])    melanoma   Chronic anxiety    Family history of breast cancer    Family history of melanoma    Family history of pancreatic cancer    Hyperlipidemia    Hypertension    Obesity    Personal history of malignant melanoma     Past Surgical History:  Procedure Laterality Date   ABDOMINAL HYSTERECTOMY     APPENDECTOMY  1978   BREAST EXCISIONAL BIOPSY Right    BREAST LUMPECTOMY WITH RADIOACTIVE SEED LOCALIZATION Right 07/10/2016   Procedure: RIGHT BREAST  LUMPECTOMY WITH RADIOACTIVE SEED LOCALIZATION;  Surgeon: Autumn Messing III, MD;  Location: [facility];  Service: General;  Laterality: Right;   GASTRIC ROUX-EN-Y  03/09/2012   Procedure: LAPAROSCOPIC ROUX-EN-Y GASTRIC;  Surgeon: Gayland Curry, MD,FACS;  [facility];  Service: General;  Laterality: N/A;   low back  2010   x3   right ureter surgery     in college   UPPER GI ENDOSCOPY  03/09/2012   Procedure: UPPER GI ENDOSCOPY;  Surgeon: Gayland Curry, MD,FACS;  [facility];  Service: General;  Laterality: N/A;     Current Outpatient Medications  Medication Sig Dispense Refill   Ascorbic Acid (VITAMIN C PO) Take 500 mg by mouth 2 (two) times daily.     ASPIRIN 81 PO Take by mouth daily.     atorvastatin (LIPITOR) 10 MG tablet Take 10 mg by mouth daily.     calcium-vitamin D (OSCAL WITH D) 500-200 MG-UNIT tablet Take 1 tablet by mouth daily.     ferrous sulfate 325 (65 FE) MG tablet Take 325 mg by mouth in the morning and at bedtime.     FLUoxetine (PROZAC) 40 MG capsule Take 20 mg by mouth daily.      hydrochlorothiazide (MICROZIDE)  12.5 MG capsule Take 12.5 mg by mouth daily.     losartan (COZAAR) 50 MG tablet Take 50 mg by mouth daily.     losartan (COZAAR) 50 MG tablet Take 50 mg by mouth 2 (two) times daily.     zolpidem (AMBIEN) 10 MG tablet Take 10 mg by mouth at bedtime as needed.     No current facility-administered medications for this visit.    Allergies:   Codeine    Social History:  The patient  reports that she has never smoked. She has never used smokeless tobacco. She reports current alcohol use of about 1.0 - 2.0 standard drink per week. She reports that she does not use drugs.   Family History:  The patient's family history includes Breast cancer in her paternal aunt; Cancer in her maternal grandmother and mother; Cancer (age of onset: 67) in her paternal aunt and paternal aunt; Congestive Heart Failure in her paternal grandmother; HIV in her  brother; Heart attack in her maternal grandfather; Heart disease in her father; Lung cancer in her mother; Melanoma (age of onset: 90) in her son; Stroke in her paternal grandfather.    ROS:  Please see the history of present illness.   Otherwise, review of systems are positive for none.   All other systems are reviewed and negative.    PHYSICAL EXAM: VS:  BP 132/78 (BP Location: Left Arm, Patient Position: Sitting, Cuff Size: Large)   Pulse 65   Ht '5\' 1"'$  (1.549 m)   Wt 172 lb (78 kg)   SpO2 98%   BMI 32.50 kg/m  , BMI Body mass index is 32.5 kg/m. GEN: Well nourished, well developed, in no acute distress  HEENT: normal  Neck: no JVD, carotid bruits, or masses Cardiac: RRR; no murmurs, rubs, or gallops,no edema  Respiratory:  clear to auscultation bilaterally, normal work of breathing GI: soft, nontender, nondistended, + BS MS: no deformity or atrophy  Skin: warm and dry, no rash Neuro:  Strength and sensation are intact Psych: euthymic mood, full affect   EKG:  EKG is ordered today. The ekg ordered today demonstrates NSR with normal Ecg. Rate 65. I have personally reviewed and interpreted this study.    Recent Labs: No results found for requested labs within last 8760 hours.    Lipid Panel    Component Value Date/Time   CHOL 242 (H) 07/29/2012 1121   TRIG 87 07/29/2012 1121   HDL 54 07/29/2012 1121   CHOLHDL 4.5 07/29/2012 1121   VLDL 17 07/29/2012 1121   LDLCALC 171 (H) 07/29/2012 1121    Labs dated 10/15/18: cholesterol 216, triglycerides 62, HDL 125, LDL 79. CMET normal.  Dated 08/28/20: cholesterol 187, triglycerides 59, HDL 96, LDL 80. CMET and CBC normal.  Wt Readings from Last 3 Encounters:  01/04/21 172 lb (78 kg)  01/09/20 165 lb 3.2 oz (74.9 kg)  11/15/18 153 lb 6.4 oz (69.6 kg)      Other studies Reviewed: Additional studies/ records that were reviewed today include: Reviewed results of Echo and MRI from December. Scanned into  record.    ASSESSMENT AND PLAN:  1.  HTN. Well controlled on combination of losartan and HCT.   2. Hypercholesterolemia. Well controlled on lipitor. 3. Morbid obesity. Excellent response to gastric bypasss.  4. Possible TIA. Work up negative. No recurrence. Continue ASA 81 mg daily. If she had recurrent symptoms would need to consider TEE and possible loop recorder.    Current medicines are reviewed  at length with the patient today.  The patient does not have concerns regarding medicines.  The following changes have been made:  See above  Labs/ tests ordered today include: none No orders of the defined types were placed in this encounter.    Disposition:   FU with me in 1 year  Signed, Amani Marseille Martinique, MD  01/04/2021 4:42 PM    [facility] [address] [place], [place], [address] Phone [phone], Fax [phone]

## 2021-01-04 ENCOUNTER — Encounter: Payer: Self-pay | Admitting: Cardiology

## 2021-01-04 ENCOUNTER — Ambulatory Visit: Payer: Medicare PPO | Admitting: Cardiology

## 2021-01-04 ENCOUNTER — Other Ambulatory Visit: Payer: Self-pay

## 2021-01-04 VITALS — BP 132/78 | HR 65 | Ht 61.0 in | Wt 172.0 lb

## 2021-01-04 DIAGNOSIS — E7800 Pure hypercholesterolemia, unspecified: Secondary | ICD-10-CM

## 2021-01-04 DIAGNOSIS — I10 Essential (primary) hypertension: Secondary | ICD-10-CM | POA: Diagnosis not present

## 2021-09-09 ENCOUNTER — Encounter (HOSPITAL_BASED_OUTPATIENT_CLINIC_OR_DEPARTMENT_OTHER): Payer: Self-pay | Admitting: Emergency Medicine

## 2021-09-09 ENCOUNTER — Emergency Department (HOSPITAL_BASED_OUTPATIENT_CLINIC_OR_DEPARTMENT_OTHER)
Admission: EM | Admit: 2021-09-09 | Discharge: 2021-09-09 | Disposition: A | Discharge status: Home / Self Care | Payer: Medicare PPO | Attending: Emergency Medicine | Admitting: Emergency Medicine

## 2021-09-09 ENCOUNTER — Emergency Department (HOSPITAL_BASED_OUTPATIENT_CLINIC_OR_DEPARTMENT_OTHER): Payer: Medicare PPO

## 2021-09-09 ENCOUNTER — Other Ambulatory Visit: Payer: Self-pay

## 2021-09-09 ENCOUNTER — Emergency Department (HOSPITAL_BASED_OUTPATIENT_CLINIC_OR_DEPARTMENT_OTHER): Payer: Medicare PPO | Admitting: Radiology

## 2021-09-09 DIAGNOSIS — I10 Essential (primary) hypertension: Secondary | ICD-10-CM | POA: Insufficient documentation

## 2021-09-09 DIAGNOSIS — M25521 Pain in right elbow: Secondary | ICD-10-CM | POA: Insufficient documentation

## 2021-09-09 DIAGNOSIS — R040 Epistaxis: Secondary | ICD-10-CM | POA: Diagnosis not present

## 2021-09-09 DIAGNOSIS — S59901A Unspecified injury of right elbow, initial encounter: Secondary | ICD-10-CM | POA: Diagnosis present

## 2021-09-09 DIAGNOSIS — W19XXXA Unspecified fall, initial encounter: Secondary | ICD-10-CM | POA: Diagnosis not present

## 2021-09-09 DIAGNOSIS — Z7982 Long term (current) use of aspirin: Secondary | ICD-10-CM | POA: Diagnosis not present

## 2021-09-09 DIAGNOSIS — S52021A Displaced fracture of olecranon process without intraarticular extension of right ulna, initial encounter for closed fracture: Secondary | ICD-10-CM

## 2021-09-09 DIAGNOSIS — S52031A Displaced fracture of olecranon process with intraarticular extension of right ulna, initial encounter for closed fracture: Secondary | ICD-10-CM | POA: Diagnosis not present

## 2021-09-09 DIAGNOSIS — Z79899 Other long term (current) drug therapy: Secondary | ICD-10-CM | POA: Diagnosis not present

## 2021-09-09 DIAGNOSIS — S0990XA Unspecified injury of head, initial encounter: Secondary | ICD-10-CM | POA: Diagnosis not present

## 2021-09-09 LAB — CBC
HCT: 37.4 % (ref 36.0–46.0)
Hemoglobin: 12.1 g/dL (ref 12.0–15.0)
MCH: 30.5 pg (ref 26.0–34.0)
MCHC: 32.4 g/dL (ref 30.0–36.0)
MCV: 94.2 fL (ref 80.0–100.0)
Platelets: 304 10*3/uL (ref 150–400)
RBC: 3.97 MIL/uL (ref 3.87–5.11)
RDW: 13.2 % (ref 11.5–15.5)
WBC: 4.5 10*3/uL (ref 4.0–10.5)
nRBC: 0 % (ref 0.0–0.2)

## 2021-09-09 LAB — URINALYSIS, ROUTINE W REFLEX MICROSCOPIC
Bilirubin Urine: NEGATIVE
Glucose, UA: NEGATIVE mg/dL
Hgb urine dipstick: NEGATIVE
Ketones, ur: NEGATIVE mg/dL
Nitrite: NEGATIVE
Protein, ur: NEGATIVE mg/dL
Specific Gravity, Urine: 1.015 (ref 1.005–1.030)
pH: 5 (ref 5.0–8.0)

## 2021-09-09 LAB — BASIC METABOLIC PANEL
Anion gap: 10 (ref 5–15)
BUN: 18 mg/dL (ref 8–23)
CO2: 24 mmol/L (ref 22–32)
Calcium: 8.7 mg/dL — ABNORMAL LOW (ref 8.9–10.3)
Chloride: 100 mmol/L (ref 98–111)
Creatinine, Ser: 0.68 mg/dL (ref 0.44–1.00)
GFR, Estimated: >60 mL/min (ref >60)
Glucose, Bld: 96 mg/dL (ref 70–99)
Potassium: 3.3 mmol/L — ABNORMAL LOW (ref 3.5–5.1)
Sodium: 134 mmol/L — ABNORMAL LOW (ref 135–145)

## 2021-09-09 LAB — CBG MONITORING, ED: Glucose-Capillary: 81 mg/dL (ref 70–99)

## 2021-09-09 MED ORDER — HYDROCODONE-ACETAMINOPHEN 5-325 MG PO TABS
1.0000 | ORAL_TABLET | Freq: Once | ORAL | Status: AC
  Administered 2021-09-09: 1 via ORAL
  Filled 2021-09-09: qty 1

## 2021-09-09 MED ORDER — HYDROCODONE-ACETAMINOPHEN 5-325 MG PO TABS: 1.0000 | ORAL_TABLET | ORAL | 0 refills | Status: AC | PRN

## 2021-09-09 NOTE — ED Provider Notes (Signed)
?[facility] EMERGENCY DEPT ?Provider Note ? ? ?CSN: [id number] ?Arrival date & time: 09/09/21  1613 ? ?  ? ?History ? ?Chief Complaint  ?Patient presents with  ? Fall  ? Elbow Pain  ? ? ?[name] [name] is a 71 y.o. female.  Patient presents to the hospital complaining of right elbow pain after a fall.  Patient's husband found patient on the floor facedown in the early afternoon.  The patient had a nosebleed at the time.  At approximate 11 this morning the patient took half an Ambien and drink a glass of wine to take a nap.  She does not remember getting up or falling.  She assumes that she got up to use the bathroom and fell.  Unknown loss of consciousness.  Denies chest pain, shortness of breath, headache.  Past medical history significant for hypertension, hyperlipidemia ? ?HPI ? ?  ? ?Home Medications ?Prior to Admission medications   ?Medication Sig Start Date End Date Taking? Authorizing Provider  ?HYDROcodone-acetaminophen (NORCO/VICODIN) 5-325 MG tablet Take 1-2 tablets by mouth every 4 (four) hours as needed for up to 2 days. 09/09/21 09/11/21 Yes Dorothyann Peng, PA-C  ?Ascorbic Acid (VITAMIN C PO) Take 500 mg by mouth 2 (two) times daily.    [provider]  ?ASPIRIN 81 PO Take by mouth daily.    [provider]  ?atorvastatin (LIPITOR) 10 MG tablet Take 10 mg by mouth daily.    [provider]  ?calcium-vitamin D (OSCAL WITH D) 500-200 MG-UNIT tablet Take 1 tablet by mouth daily.    [provider]  ?ferrous sulfate 325 (65 FE) MG tablet Take 325 mg by mouth in the morning and at bedtime.    [provider]  ?FLUoxetine (PROZAC) 40 MG capsule Take 20 mg by mouth daily.     [provider]  ?hydrochlorothiazide (MICROZIDE) 12.5 MG capsule Take 12.5 mg by mouth daily.    [provider]  ?losartan (COZAAR) 50 MG tablet Take 50 mg by mouth daily.    [provider]  ?losartan (COZAAR) 50 MG tablet Take 50 mg by mouth 2 (two)  times daily. 07/29/20   [provider]  ?zolpidem (AMBIEN) 10 MG tablet Take 10 mg by mouth at bedtime as needed. 07/29/20   [provider]  ?   ? ?Allergies    ?Codeine   ? ?Review of Systems   ?Review of Systems  ?Respiratory:  Negative for shortness of breath.   ?Cardiovascular:  Negative for chest pain.  ?Gastrointestinal:  Negative for abdominal pain.  ?Musculoskeletal:  Positive for arthralgias. Negative for neck pain.  ?Skin:  Negative for wound.  ?Neurological:  Negative for headaches.  ? ?Physical Exam ?Updated Vital Signs ?BP (!) 141/111 (BP Location: Left Arm)   Pulse 69   Temp (!) 97.5 ?F (36.4 ?C)   Resp 16   Ht '5\' 1"'$  (1.549 m)   Wt 70.3 kg   SpO2 100%   BMI 29.29 kg/m?  ?Physical Exam ?Vitals and nursing note reviewed.  ?Constitutional:   ?   General: She is not in acute distress. ?HENT:  ?   Head: Normocephalic and atraumatic.  ?Eyes:  ?   Conjunctiva/sclera: Conjunctivae normal.  ?   Pupils: Pupils are equal, round, and reactive to light.  ?Cardiovascular:  ?   Rate and Rhythm: Normal rate and regular rhythm.  ?   Pulses: Normal pulses.  ?Pulmonary:  ?   Effort: Pulmonary effort is normal.  ?  Breath sounds: Normal breath sounds.  ?Musculoskeletal:     ?   General: No swelling.  ?   Cervical back: Normal range of motion.  ?   Comments: Tenderness to palpation of right elbow.  Patient reluctant to move due to pain.  Patient is neurovascularly intact at the distal right upper extremity.  ?Skin: ?   General: Skin is warm and dry.  ?Neurological:  ?   Mental Status: She is alert and oriented to person, place, and time.  ? ? ?ED Results / Procedures / Treatments   ?Labs ?(all labs ordered are listed, but only abnormal results are displayed) ?Labs Reviewed  ?BASIC METABOLIC PANEL - Abnormal; Notable for the following components:  ?    Result Value  ? Sodium 134 (*)   ? Potassium 3.3 (*)   ? Calcium 8.7 (*)   ? All other components within normal limits  ?URINALYSIS, ROUTINE W  REFLEX MICROSCOPIC - Abnormal; Notable for the following components:  ? Leukocytes,Ua LARGE (*)   ? All other components within normal limits  ?CBC  ?CBG MONITORING, ED  ? ? ?EKG ?EKG Interpretation ? ?Date/Time:  Monday Sep 09 2021 16:32:59 EDT ?Ventricular Rate:  74 ?PR Interval:  120 ?QRS Duration: 76 ?QT Interval:  424 ?QTC Calculation: 470 ?R Axis:   7 ?Text Interpretation: Normal sinus rhythm Normal ECG When compared with ECG of 07-Jul-2016 10:19, T wave inversion no longer evident in Inferior leads Confirmed by Octaviano Glow [id number]) on 09/09/2021 5:24:19 PM ? ?Radiology ?DG Elbow Complete Right ? ?Result Date: 09/09/2021 ?CLINICAL DATA:  Fall. EXAM: RIGHT ELBOW - COMPLETE 3+ VIEW COMPARISON:  None. FINDINGS: Acute intra-articular fracture of the olecranon with up to 1.7 cm distraction. No additional fracture. No dislocation. Large joint effusion. Prominent soft tissue swelling around the elbow. IMPRESSION: 1. Acute distracted intra-articular fracture of the olecranon. Electronically Signed   By: Titus Dubin M.D.   On: 09/09/2021 17:16  ? ?CT Head Wo Contrast ? ?Result Date: 09/09/2021 ?CLINICAL DATA:  Status post fall. EXAM: CT HEAD WITHOUT CONTRAST TECHNIQUE: Contiguous axial images were obtained from the base of the skull through the vertex without intravenous contrast. RADIATION DOSE REDUCTION: This exam was performed according to the departmental dose-optimization program which includes automated exposure control, adjustment of the mA and/or kV according to patient size and/or use of iterative reconstruction technique. COMPARISON:  None. FINDINGS: Brain: There is mild cerebral atrophy with widening of the extra-axial spaces and ventricular dilatation. There are areas of decreased attenuation within the white matter tracts of the supratentorial brain, consistent with microvascular disease changes. Vascular: No hyperdense vessel or unexpected calcification. Skull: Normal. Negative for fracture or focal  lesion. Sinuses/Orbits: No acute finding. Other: None. IMPRESSION: No acute intracranial abnormality. Electronically Signed   By: Virgina Norfolk M.D.   On: 09/09/2021 17:18  ? ?CT Cervical Spine Wo Contrast ? ?Result Date: 09/09/2021 ?CLINICAL DATA:  Status post fall. EXAM: CT CERVICAL SPINE WITHOUT CONTRAST TECHNIQUE: Multidetector CT imaging of the cervical spine was performed without intravenous contrast. Multiplanar CT image reconstructions were also generated. RADIATION DOSE REDUCTION: This exam was performed according to the departmental dose-optimization program which includes automated exposure control, adjustment of the mA and/or kV according to patient size and/or use of iterative reconstruction technique. COMPARISON:  None. FINDINGS: Alignment: There is straightening of the normal cervical spine lordosis. Skull base and vertebrae: No acute fracture. No primary bone lesion or focal pathologic process. Soft tissues and spinal canal: No prevertebral  fluid or swelling. No visible canal hematoma. Disc levels: Marked severity endplate sclerosis and mild to moderate severity anterior osteophyte formation is seen at the level of C5-C6. Mild posterior bony spurring is seen at C3-C4, C4-C5 and C5-C6. There is fusion of the C6-C7 intervertebral disc space with marked severity intervertebral disc space narrowing at the level of C5-C6. Bilateral marked severity multilevel facet joint hypertrophy is noted. Upper chest: Negative. Other: None. IMPRESSION: 1. No acute fracture or subluxation of the cervical spine. 2. Multilevel degenerative changes, most prominent at the levels of C5-C6 and C6-C7. 3. 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 Electronically Signed   By: Virgina Norfolk M.D.   On: 09/09/2021 17:17   ? ?Procedures ?[facility] KitchenOrtho Injury Treatment ? ?Date/Time: 09/09/2021 6:21 PM ?Performed by: Dorothyann Peng, PA-C ?Authorized by: Dorothyann Peng, PA-C  ? ?Consent:  ?  Consent obtained:  Verbal ?  Consent  given by:  Patient ?  Risks discussed:  Nerve damage, restricted joint movement and vascular damage ?  Alternatives discussed:  No treatmentInjury location: elbow ?Location details: right elbow ?Injury type: fractur

## 2021-09-09 NOTE — ED Triage Notes (Signed)
Pt via pov from home with unwitnessed fall today. Pt reports that she awakened from a nap and fell; husband reports that he came in and found her on the floor, conscious and bleeding, apparently from her nose. Pt returned from a trip this morning, and took an [place] and drank a glass of wine and went to bed. Pt has hx of TIA 1.5 years ago. Pt alert & oriented, nad noted.  ?

## 2021-09-09 NOTE — ED Notes (Signed)
Pt NAD in bed holding right elbow with ice pack. A/ox4, pt states she took her nighttime [place] and thinks she got out of bed to urinate but is not sure how she fell. Pt denies head/neck/back pain, -point tenderness or findings on physical assessment to head. Pt does have hematoma/bruising to right elbow. +pmsc.  ?

## 2021-09-09 NOTE — Discharge Instructions (Addendum)
You have a fracture of the right elbow.  This was placed in a splint today.  Please keep the arm in the splint at all times.  I have prescribed pain medication to be used as needed over the next 2 days.  I have provided contact information for Dr. Mable Fill.  You should call his office tomorrow morning to schedule an appointment for Wednesday morning.  He is planning on seeing you that morning for further evaluation and management. ?

## 2021-09-09 NOTE — ED Notes (Signed)
Pt NAD, a/ox4. Pt verbalizes understanding of all DC and f/u instructions. All questions answered. Pt walks with steady gait to lobby at DC.  ? ?

## 2021-09-11 ENCOUNTER — Other Ambulatory Visit: Payer: Self-pay | Admitting: Orthopedic Surgery

## 2021-09-12 ENCOUNTER — Other Ambulatory Visit: Payer: Self-pay

## 2021-09-12 ENCOUNTER — Encounter (HOSPITAL_BASED_OUTPATIENT_CLINIC_OR_DEPARTMENT_OTHER): Payer: Self-pay | Admitting: Orthopedic Surgery

## 2021-09-20 ENCOUNTER — Ambulatory Visit (HOSPITAL_BASED_OUTPATIENT_CLINIC_OR_DEPARTMENT_OTHER): Payer: Medicare PPO | Admitting: Anesthesiology

## 2021-09-20 ENCOUNTER — Encounter (HOSPITAL_BASED_OUTPATIENT_CLINIC_OR_DEPARTMENT_OTHER)
Admission: RE | Disposition: A | Discharge status: Home / Self Care | Payer: Self-pay | Source: Ambulatory Visit | Attending: Orthopedic Surgery

## 2021-09-20 ENCOUNTER — Ambulatory Visit (HOSPITAL_COMMUNITY): Payer: Medicare PPO

## 2021-09-20 ENCOUNTER — Encounter (HOSPITAL_BASED_OUTPATIENT_CLINIC_OR_DEPARTMENT_OTHER): Payer: Self-pay | Admitting: Orthopedic Surgery

## 2021-09-20 ENCOUNTER — Ambulatory Visit (HOSPITAL_BASED_OUTPATIENT_CLINIC_OR_DEPARTMENT_OTHER)
Admission: RE | Admit: 2021-09-20 | Discharge: 2021-09-20 | Disposition: A | Discharge status: Home / Self Care | Payer: Medicare PPO | Source: Ambulatory Visit | Attending: Orthopedic Surgery | Admitting: Orthopedic Surgery

## 2021-09-20 ENCOUNTER — Other Ambulatory Visit: Payer: Self-pay

## 2021-09-20 DIAGNOSIS — Z6831 Body mass index (BMI) 31.0-31.9, adult: Secondary | ICD-10-CM | POA: Diagnosis not present

## 2021-09-20 DIAGNOSIS — K219 Gastro-esophageal reflux disease without esophagitis: Secondary | ICD-10-CM | POA: Insufficient documentation

## 2021-09-20 DIAGNOSIS — I10 Essential (primary) hypertension: Secondary | ICD-10-CM | POA: Insufficient documentation

## 2021-09-20 DIAGNOSIS — S52031A Displaced fracture of olecranon process with intraarticular extension of right ulna, initial encounter for closed fracture: Secondary | ICD-10-CM

## 2021-09-20 DIAGNOSIS — Z79899 Other long term (current) drug therapy: Secondary | ICD-10-CM | POA: Insufficient documentation

## 2021-09-20 DIAGNOSIS — E669 Obesity, unspecified: Secondary | ICD-10-CM | POA: Insufficient documentation

## 2021-09-20 DIAGNOSIS — S52021A Displaced fracture of olecranon process without intraarticular extension of right ulna, initial encounter for closed fracture: Secondary | ICD-10-CM | POA: Insufficient documentation

## 2021-09-20 DIAGNOSIS — Z9884 Bariatric surgery status: Secondary | ICD-10-CM | POA: Insufficient documentation

## 2021-09-20 DIAGNOSIS — W19XXXA Unspecified fall, initial encounter: Secondary | ICD-10-CM | POA: Diagnosis not present

## 2021-09-20 HISTORY — PX: ORIF ELBOW FRACTURE: SHX5031

## 2021-09-20 SURGERY — OPEN REDUCTION INTERNAL FIXATION (ORIF) ELBOW/OLECRANON FRACTURE
Anesthesia: General | Site: Elbow | Laterality: Right

## 2021-09-20 MED ORDER — DEXAMETHASONE SODIUM PHOSPHATE 4 MG/ML IJ SOLN
INTRAMUSCULAR | Status: DC | PRN
  Administered 2021-09-20: 8 mg via INTRAVENOUS

## 2021-09-20 MED ORDER — MIDAZOLAM HCL 2 MG/2ML IJ SOLN
2.0000 mg | Freq: Once | INTRAMUSCULAR | Status: AC
Start: 2021-09-20 — End: 2021-09-20
  Administered 2021-09-20: 2 mg via INTRAVENOUS

## 2021-09-20 MED ORDER — LIDOCAINE HCL (CARDIAC) PF 100 MG/5ML IV SOSY
PREFILLED_SYRINGE | INTRAVENOUS | Status: DC | PRN
  Administered 2021-09-20: 20 mg via INTRAVENOUS

## 2021-09-20 MED ORDER — LACTATED RINGERS IV SOLN: INTRAVENOUS | Status: DC

## 2021-09-20 MED ORDER — OXYCODONE HCL 5 MG/5ML PO SOLN: 5.0000 mg | Freq: Once | ORAL | Status: DC | PRN

## 2021-09-20 MED ORDER — CEFAZOLIN SODIUM-DEXTROSE 2-4 GM/100ML-% IV SOLN
2.0000 g | INTRAVENOUS | Status: AC
  Administered 2021-09-20: 2 g via INTRAVENOUS

## 2021-09-20 MED ORDER — ROCURONIUM BROMIDE 100 MG/10ML IV SOLN
INTRAVENOUS | Status: DC | PRN
Start: 2021-09-20 — End: 2021-09-20
  Administered 2021-09-20 (×3): 20 mg via INTRAVENOUS
  Administered 2021-09-20: 60 mg via INTRAVENOUS

## 2021-09-20 MED ORDER — SUGAMMADEX SODIUM 200 MG/2ML IV SOLN
INTRAVENOUS | Status: DC | PRN
  Administered 2021-09-20: 175 mg via INTRAVENOUS

## 2021-09-20 MED ORDER — EPHEDRINE SULFATE (PRESSORS) 50 MG/ML IJ SOLN
INTRAMUSCULAR | Status: DC | PRN
  Administered 2021-09-20: 10 mg via INTRAVENOUS
  Administered 2021-09-20: 5 mg via INTRAVENOUS
  Administered 2021-09-20: 10 mg via INTRAVENOUS

## 2021-09-20 MED ORDER — PHENYLEPHRINE HCL (PRESSORS) 10 MG/ML IV SOLN
INTRAVENOUS | Status: AC
  Filled 2021-09-20: qty 1

## 2021-09-20 MED ORDER — PROPOFOL 10 MG/ML IV BOLUS
INTRAVENOUS | Status: DC | PRN
  Administered 2021-09-20: 100 mg via INTRAVENOUS

## 2021-09-20 MED ORDER — ACETAMINOPHEN 500 MG PO TABS
1000.0000 mg | ORAL_TABLET | Freq: Once | ORAL | Status: AC
  Administered 2021-09-20: 1000 mg via ORAL

## 2021-09-20 MED ORDER — FENTANYL CITRATE (PF) 100 MCG/2ML IJ SOLN
100.0000 ug | Freq: Once | INTRAMUSCULAR | Status: AC
  Administered 2021-09-20: 100 ug via INTRAVENOUS

## 2021-09-20 MED ORDER — PHENYLEPHRINE HCL-NACL 20-0.9 MG/250ML-% IV SOLN
INTRAVENOUS | Status: DC | PRN
  Administered 2021-09-20: 25 ug/min via INTRAVENOUS

## 2021-09-20 MED ORDER — BUPIVACAINE-EPINEPHRINE (PF) 0.5% -1:200000 IJ SOLN
INTRAMUSCULAR | Status: DC | PRN
  Administered 2021-09-20: 10 mL via PERINEURAL

## 2021-09-20 MED ORDER — ONDANSETRON HCL 4 MG/2ML IJ SOLN
INTRAMUSCULAR | Status: DC | PRN
  Administered 2021-09-20: 4 mg via INTRAVENOUS

## 2021-09-20 MED ORDER — ROCURONIUM BROMIDE 10 MG/ML (PF) SYRINGE
PREFILLED_SYRINGE | INTRAVENOUS | Status: AC
  Filled 2021-09-20: qty 10

## 2021-09-20 MED ORDER — PROMETHAZINE HCL 12.5 MG PO TABS: 12.5000 mg | ORAL_TABLET | Freq: Four times a day (QID) | ORAL | 0 refills | Status: DC | PRN

## 2021-09-20 MED ORDER — ONDANSETRON HCL 4 MG/2ML IJ SOLN
INTRAMUSCULAR | Status: AC
  Filled 2021-09-20: qty 2

## 2021-09-20 MED ORDER — FENTANYL CITRATE (PF) 100 MCG/2ML IJ SOLN
INTRAMUSCULAR | Status: AC
  Filled 2021-09-20: qty 2

## 2021-09-20 MED ORDER — ROCURONIUM BROMIDE 10 MG/ML (PF) SYRINGE
PREFILLED_SYRINGE | INTRAVENOUS | Status: AC
  Filled 2021-09-20: qty 20

## 2021-09-20 MED ORDER — BUPIVACAINE LIPOSOME 1.3 % IJ SUSP
INTRAMUSCULAR | Status: DC | PRN
  Administered 2021-09-20: 10 mL via PERINEURAL

## 2021-09-20 MED ORDER — OXYCODONE HCL 5 MG PO TABS
5.0000 mg | ORAL_TABLET | Freq: Four times a day (QID) | ORAL | 0 refills | Status: DC | PRN
Start: 2021-09-20 — End: 2022-06-23

## 2021-09-20 MED ORDER — LIDOCAINE 2% (20 MG/ML) 5 ML SYRINGE
INTRAMUSCULAR | Status: AC
  Filled 2021-09-20: qty 5

## 2021-09-20 MED ORDER — MIDAZOLAM HCL 2 MG/2ML IJ SOLN: 0.5000 mg | Freq: Once | INTRAMUSCULAR | Status: DC | PRN

## 2021-09-20 MED ORDER — OXYCODONE HCL 5 MG PO TABS: 5.0000 mg | ORAL_TABLET | Freq: Once | ORAL | Status: DC | PRN

## 2021-09-20 MED ORDER — HYDROMORPHONE HCL 1 MG/ML IJ SOLN: 0.2500 mg | INTRAMUSCULAR | Status: DC | PRN

## 2021-09-20 MED ORDER — MEPERIDINE HCL 25 MG/ML IJ SOLN: 6.2500 mg | INTRAMUSCULAR | Status: DC | PRN

## 2021-09-20 MED ORDER — CEFAZOLIN SODIUM-DEXTROSE 2-4 GM/100ML-% IV SOLN
INTRAVENOUS | Status: AC
  Filled 2021-09-20: qty 100

## 2021-09-20 MED ORDER — MIDAZOLAM HCL 2 MG/2ML IJ SOLN
INTRAMUSCULAR | Status: AC
  Filled 2021-09-20: qty 2

## 2021-09-20 MED ORDER — DEXAMETHASONE SODIUM PHOSPHATE 10 MG/ML IJ SOLN
INTRAMUSCULAR | Status: AC
  Filled 2021-09-20: qty 1

## 2021-09-20 MED ORDER — FENTANYL CITRATE (PF) 100 MCG/2ML IJ SOLN: INTRAMUSCULAR | Status: DC | PRN

## 2021-09-20 MED ORDER — PROPOFOL 10 MG/ML IV BOLUS
INTRAVENOUS | Status: AC
  Filled 2021-09-20: qty 20

## 2021-09-20 MED ORDER — EPHEDRINE 5 MG/ML INJ
INTRAVENOUS | Status: AC
  Filled 2021-09-20: qty 5

## 2021-09-20 MED ORDER — ACETAMINOPHEN 500 MG PO TABS
ORAL_TABLET | ORAL | Status: AC
  Filled 2021-09-20: qty 2

## 2021-09-20 SURGICAL SUPPLY — 82 items
BIT DRILL 5.0 CANN LRG 215 (BIT) ×1 IMPLANT
BIT DRILL QC 2.5X135 (BIT) ×1 IMPLANT
BIT DRILL QC 2X140 (BIT) ×1 IMPLANT
BLADE SURG 10 STRL SS (BLADE) ×2 IMPLANT
BLADE SURG 15 STRL LF DISP TIS (BLADE) ×2 IMPLANT
BLADE SURG 15 STRL SS (BLADE) ×4
BNDG ELASTIC 4X5.8 VLCR NS LF (GAUZE/BANDAGES/DRESSINGS) ×2 IMPLANT
BNDG ELASTIC 4X5.8 VLCR STR LF (GAUZE/BANDAGES/DRESSINGS) ×2 IMPLANT
BNDG ESMARK 4X9 LF (GAUZE/BANDAGES/DRESSINGS) ×2 IMPLANT
CHLORAPREP W/TINT 26 (MISCELLANEOUS) ×4 IMPLANT
COVER BACK TABLE 60X90IN (DRAPES) ×2 IMPLANT
CUFF TOURN SGL QUICK 24 (TOURNIQUET CUFF) ×2
CUFF TRNQT CYL 24X4X16.5-23 (TOURNIQUET CUFF) ×1 IMPLANT
DERMABOND ADVANCED (GAUZE/BANDAGES/DRESSINGS) ×1
DERMABOND ADVANCED .7 DNX12 (GAUZE/BANDAGES/DRESSINGS) IMPLANT
DRAPE C-ARM 42X72 X-RAY (DRAPES) ×2 IMPLANT
DRAPE C-ARMOR (DRAPES) ×2 IMPLANT
DRAPE EXTREMITY T 121X128X90 (DISPOSABLE) ×2 IMPLANT
DRAPE IMP U-DRAPE 54X76 (DRAPES) ×3 IMPLANT
DRAPE U-SHAPE 47X51 STRL (DRAPES) ×2 IMPLANT
DRSG PAD ABDOMINAL 8X10 ST (GAUZE/BANDAGES/DRESSINGS) ×2 IMPLANT
ELECT REM PT RETURN 9FT ADLT (ELECTROSURGICAL) ×2
ELECTRODE REM PT RTRN 9FT ADLT (ELECTROSURGICAL) ×1 IMPLANT
GAUZE SPONGE 4X4 12PLY STRL (GAUZE/BANDAGES/DRESSINGS) IMPLANT
GAUZE XEROFORM 1X8 LF (GAUZE/BANDAGES/DRESSINGS) IMPLANT
GAUZE XEROFORM 5X9 LF (GAUZE/BANDAGES/DRESSINGS) IMPLANT
GLOVE BIO SURGEON STRL SZ8 (GLOVE) ×3 IMPLANT
GLOVE BIOGEL PI IND STRL 8 (GLOVE) ×1 IMPLANT
GLOVE BIOGEL PI INDICATOR 8 (GLOVE) ×2
GLOVE ORTHO TXT STRL SZ7.5 (GLOVE) ×3 IMPLANT
GOWN STRL REUS W/ TWL LRG LVL3 (GOWN DISPOSABLE) ×1 IMPLANT
GOWN STRL REUS W/ TWL XL LVL3 (GOWN DISPOSABLE) ×1 IMPLANT
GOWN STRL REUS W/TWL LRG LVL3 (GOWN DISPOSABLE) ×2
GOWN STRL REUS W/TWL XL LVL3 (GOWN DISPOSABLE) ×4
GUIDEWIRE THRD 2.8X220 (WIRE) ×1 IMPLANT
K-WIRE 1.6X150 (WIRE) ×2
KWIRE 1.6X150 (WIRE) IMPLANT
NS IRRIG 1000ML POUR BTL (IV SOLUTION) ×2 IMPLANT
PACK BASIN DAY SURGERY FS (CUSTOM PROCEDURE TRAY) ×2 IMPLANT
PAD CAST 4YDX4 CTTN HI CHSV (CAST SUPPLIES) ×2 IMPLANT
PADDING CAST COTTON 4X4 STRL (CAST SUPPLIES) ×4
PENCIL SMOKE EVACUATOR (MISCELLANEOUS) ×2 IMPLANT
PLATE OLECRANON PROX 2.7/3.5 (Plate) ×1 IMPLANT
SCREW CANN LT 6.6X100 (Screw) ×1 IMPLANT
SCREW CORTEX 3.5 24MM (Screw) ×1 IMPLANT
SCREW CORTEX 3.5 26MM (Screw) ×1 IMPLANT
SCREW CORTEX 3.5 30MM (Screw) ×1 IMPLANT
SCREW LOCK CORT ST 3.5X24 (Screw) IMPLANT
SCREW LOCK CORT ST 3.5X26 (Screw) IMPLANT
SCREW LOCK CORT ST 3.5X30 (Screw) IMPLANT
SCREW LOCK VA ST 2.7X14 (Screw) ×1 IMPLANT
SCREW LOCK VA ST 2.7X18 (Screw) ×1 IMPLANT
SCREW LOCK VA ST 2.7X20 (Screw) ×1 IMPLANT
SHEET MEDIUM DRAPE 40X70 STRL (DRAPES) ×2 IMPLANT
SLEEVE SCD COMPRESS KNEE MED (STOCKING) ×2 IMPLANT
SPLINT FAST PLASTER 5X30 (CAST SUPPLIES) ×1
SPLINT PLASTER CAST FAST 5X30 (CAST SUPPLIES) ×1 IMPLANT
SPONGE T-LAP 18X18 ~~LOC~~+RFID (SPONGE) ×2 IMPLANT
STOCKINETTE 4X48 STRL (DRAPES) IMPLANT
STOCKINETTE IMPERVIOUS LG (DRAPES) ×1 IMPLANT
STRIP CLOSURE SKIN 1/2X4 (GAUZE/BANDAGES/DRESSINGS) ×2 IMPLANT
SUCTION FRAZIER HANDLE 10FR (MISCELLANEOUS) ×2
SUCTION TUBE FRAZIER 10FR DISP (MISCELLANEOUS) ×1 IMPLANT
SUT BONE WAX W31G (SUTURE) ×1 IMPLANT
SUT ETHILON 3 0 PS 1 (SUTURE) IMPLANT
SUT FIBERWIRE #2 38 T-5 BLUE (SUTURE) ×4
SUT MNCRL AB 3-0 PS2 18 (SUTURE) ×2 IMPLANT
SUT MON AB 2-0 CT1 36 (SUTURE) ×2 IMPLANT
SUT VIC AB 0 CT1 27 (SUTURE) ×2
SUT VIC AB 0 CT1 27XBRD ANBCTR (SUTURE) ×1 IMPLANT
SUT VIC AB 2-0 CT1 27 (SUTURE) ×2
SUT VIC AB 2-0 CT1 TAPERPNT 27 (SUTURE) ×1 IMPLANT
SUT VIC AB 2-0 SH 27 (SUTURE)
SUT VIC AB 2-0 SH 27XBRD (SUTURE) IMPLANT
SUTURE FIBERWR #2 38 T-5 BLUE (SUTURE) IMPLANT
SYR BULB EAR ULCER 3OZ GRN STR (SYRINGE) ×2 IMPLANT
TOWEL GREEN STERILE FF (TOWEL DISPOSABLE) ×4 IMPLANT
TUBE CONNECTING 20X1/4 (TUBING) ×2 IMPLANT
UNDERPAD 30X36 HEAVY ABSORB (UNDERPADS AND DIAPERS) ×2 IMPLANT
WASHER FOR 6.5 CANN SCREW (Washer) ×2 IMPLANT
WASHER ORTH 6.5 CANN SCR (Washer) IMPLANT
WIRE PLATE REDUCTION 1.25 (Wire) ×1 IMPLANT

## 2021-09-20 NOTE — Op Note (Signed)
OPERATIVE NOTE ? ?[name] [name] ?female ?71 y.o. ?09/20/2021 ? ?PREOPERATIVE DIAGNOSIS: ?Right displaced intra-articular olecranon fracture ? ?POSTOPERATIVE DIAGNOSIS: ?Right displaced intra-articular olecranon fracture (J44.920F) ? ?PROCEDURE(S): ?Open reduction internal fixation right displaced intra-articular olecranon fracture (00712) ?Operative use of fluoroscopy for above procedure(s) (76000)  ? ?SURGEON: Georgeanna Harrison, M.D. ? ?ASSISTANT(S): Wandra Arthurs, RNFA ? ?ANESTHESIA: General ? ?FINDINGS: ?Preoperative Examination: ?RUE: Unfortunately block was performed by anesthesia prior to examination.  Neurovascular evaluation in the preoperative area not possible due to block.  Skin is intact and benign.  There is swelling and ecchymosis surrounding the elbow and proximal forearm.  Normal distal pulses.  Warm and well-perfused distally. ? ?Operative Findings: ?Displaced intra-articular fracture of the olecranon process, without significant comminution, transverse laterally and oblique component medially.  Anatomic reduction of joint surface under direct visualization both medially and laterally.  Maintenance of reduction following placement of intramedullary screw and washer.  Full extension following internal fixation. ? ?IMPLANTS: ?Implant Name Type Inv. Item Serial No. Manufacturer Lot No. LRB No. Used Action  ?[id number] cannulated screw Screw   Synthes  Right 1 Implanted  ?6.5 washer Orthopedic Implant   Synthes  Right 1 Implanted  ? ? ?INDICATIONS:  ?The patient is a 71 y.o. female who injured her right elbow in a fall.  She was found to have a displaced intra-articular fracture of the olecranon process.  Given the displaced, intra-articular nature of the fracture, she was recommended to undergo surgical treatment with open reduction and internal fixation.  She understood the risks, benefits and alternatives to surgery which include but are not limited to bleeding, wound healing complications, infection,  damage to surrounding structures, persistent pain, stiffness, lack of improvement, potential for subsequent arthritis or worsening of pre-existing arthritis, nonunion, malunion, and need for further surgery, as well as complications related to anesthesia, cardiovascular complications, and death.  She also understood the potential for continued pain, and that there were no guarantees of acceptable outcome.  After weighing these risks the patient opted to proceed with surgery. ? ?TECHNIQUE: ?Patient was identified in the preoperative holding area.  The right elbow was marked by myself.  Consent was signed by myself and the patient.  Supraclavicular block was performed by anesthesia in the preoperative holding area.  Patient was taken to the operative suite and placed supine on the operative table.  Anesthesia was induced by the anesthesia team.  The patient was positioned appropriately for the procedure and all bony prominences were well padded.  A non-sterile arm tourniquet was placed on the operative extremity.  Preoperative antibiotics were given. The extremity was prepped and draped in the usual sterile fashion and surgical timeout was performed.  Surgery was performed in the prone position, with the arm over an arm holder.  Midline longitudinal incision centered over the olecranon process was marked out on the skin.  The arm was exsanguinated with an Esmarch bandage, tourniquet was inflated to 250 mmHg.  Skin was incised sharply, and underlying subcutaneous fat was dissected down onto the fascial layer with Bovie.  Medial and lateral full-thickness skin flaps were raised.  Subperiosteal flaps were elevated both medially and laterally on the olecranon process to allow visualization of the joint surface.  The triceps was split in line with the fibers sharply to the tip of the olecranon process, and small triceps flaps were elevated to accommodate internal fixation.  The fracture was thoroughly irrigated and cleaned  of all debris, and the edges were debrided.  An initial reduction  was performed using a combination of direct visualization of the joint surface and fluoroscopy, and provisional fixation was performed with pointed reduction clamps and subsequently crossing K wires.  Initially, we attempted to stabilize the fracture with a locking plate; however, it was noted that with placement of screws there was some increased gapping of the fracture at the joint surface.  It was noted that the fracture was stably and anatomically reduced with compression across the fracture site.  I therefore elected to proceed with intramedullary screw fixation.  With the fracture anatomically reduced and clamped in position, a guidewire for a 6.5 millimeters screw was advanced centrally through the proximal fragment and down the intramedullary canal of the ulna distally.  Measurement was obtained off the wire, and a 100 mm length screw was selected.  The proximal cortex was drilled, and a screw with a washer was advanced over the guidewire.  Prior to final tightening, two #2 FiberWire sutures were delivered through the washer for repair of the triceps.  The screw was then fully tightened into position, resulting in excellent compression across the fracture without over reduction of the joint surface, and maintenance of the anatomic reduction of the intra-articular portion of the fracture upon direct visual inspection.  The triceps was repaired on each side with a Mason-Allen stitch tied securely to the washer.  The split in the triceps muscle was closed with interrupted figure-of-eight sutures using #2 FiberWire.  The tourniquet was let down.  The wound was copiously irrigated and hemostasis was obtained.  Deep fascial layer was closed with interrupted figure-of-eight's with #1 PDS suture.  The deeper fat layers were closed with simple inverted interrupted 0 Vicryl.  Skin was closed with simple inverted interrupted 2-0 Monocryl in the deep  dermal layer, followed by running 3 Monocryl subcuticular and sealed with Dermabond.  Tails of the suture were secured with Steri-Strips.  Dry sterile dressing consisting of 4 x 4's, ABD, and Webril was placed over the wound.  Appropriately molded posterior long-arm splint was applied with the elbow at approximately 95 degrees.  Patient was awakened from anesthesia and transferred to PACU in stable condition.  She tolerated procedure well.  There were no complications. ? ?POST OPERATIVE INSTRUCTIONS: ?Mobility: Keep splint clean, dry, and intact.  Use sling at all times except when in bed.  If showering, okay to remove sling, but place protective plastic over splint to protect the splint. ?Pain control: Continue to wean/titrate to appropriate oral regimen ?DVT Prophylaxis: Continue 81 mg aspirin ?RUE: Nonweightbearing in splint and sling. ?Disposition: Home ?Dressing care:  Maintain splint.  Keep it clean, dry, and intact.  Splint will be removed in clinic at first follow-up visit. ?Follow-up: Please call [facility] [phone]) to schedule follow-op appointment for 2 weeks after surgery. ? ?TOURNIQUET TIME: 120 minutes ? ?BLOOD LOSS: 50 mL ?        ?DRAINS: none ?        ?SPECIMEN: none ?      ?COMPLICATIONS:  * No complications entered in OR log * ?        ?DISPOSITION: PACU - hemodynamically stable. ?        ?CONDITION: stable  ? ?Georgeanna Harrison M.D. ?Orthopaedic Surgery ?[facility] ? ? ?Portions of the record have been created with voice recognition software.  Grammatical and punctuation errors, random word insertions, wrong-word or "sound-a-like" substitutions, pronoun errors (inaccuracies and/or substitutions), and/or incomplete sentences may have occurred due to the  inherent limitations of voice recognition software.  Not all errors are caught or corrected.  Although every attempt is made to root out erroneous and incomplete transcription, the  note may still not fully represent the intent or opinion of the author.  Read the chart carefully and recognize, using context, where errors/substitutions have occurred.  Any questions or concerns about the c

## 2021-09-20 NOTE — Anesthesia Postprocedure Evaluation (Signed)
Anesthesia Post Note ? ?Patient: [name] [name] ? ?Procedure(s) Performed: OPEN REDUCTION INTERNAL FIXATION (ORIF) RIGHT OLECRANON FRACTURE (Right: Elbow) ? ?  ? ?Patient location during evaluation: Phase II ?Anesthesia Type: General ?Level of consciousness: awake and alert, patient cooperative and oriented ?Pain management: pain level controlled ?Vital Signs Assessment: post-procedure vital signs reviewed and stable ?Respiratory status: spontaneous breathing, nonlabored ventilation and respiratory function stable ?Cardiovascular status: blood pressure returned to baseline and stable ?Postop Assessment: no apparent nausea or vomiting and able to ambulate ?Anesthetic complications: no ? ? ?No notable events documented. ? ?Last Vitals:  ?Vitals:  ? 09/20/21 1700 09/20/21 1715  ?BP: 107/76   ?Pulse: 69 70  ?Resp: 15 18  ?Temp:    ?SpO2: 99% 97%  ?  ?Last Pain:  ?Vitals:  ? 09/20/21 1715  ?TempSrc:   ?PainSc: 0-No pain  ? ? ?  ?  ?  ?  ?  ?  ? ?Luisdaniel Kenton,E. Miho Monda ? ? ? ? ?

## 2021-09-20 NOTE — Discharge Instructions (Addendum)
Discharge instructions for Dr. Georgeanna Harrison, M.D.: Please refer to the two-sided discharge instructions paper that Dr. Mable Fill placed in the patient's paper chart. Please give this to the patient to take home after reviewing with the patient!!  ? ?General discharge instructions: ? ?PLEASE REFER TO TWO-SIDED PAPER INSTRUCTIONS IN PAPER CHART FOR SPECIFIC INSTRUCTIONS!!! ? ?Diet: As you were doing prior to hospitalization. ?Shower:  Unless otherwise specified (i.e. on two-sided paper instructions with paper chart) may shower but keep the wounds dry, use an occlusive plastic wrap, NO SOAKING IN TUB.  If the bandage gets wet, change with a clean dry gauze. ?Dressing:  Unless otherwise specified (i.e. on two-sided paper instructions with paper chart), may change your dressing 3-5 days after surgery.  Then change the dressing daily with sterile gauze dressing.  If there are sticky tapes (steri-strips) on your wounds and all the stitches are absorbable.  Leave the steri-strips in place when changing your dressings, they will peel off with time, usually 2-3 weeks. ?Activity:  Increase activity slowly as tolerated, but follow the restrictions on the two-sided paper discharge instructions sheet that Dr. Mable Fill placed in the paper chart.  No lifting or driving for 6 weeks. ?Weight Bearing: NON-WEIGHTBEARING (NWB) on the RIGHT UPPER EXTREMITY. ?To prevent constipation: You may use over-the-counter stool softener(s) such as Colace (over the counter) 100 mg by mouth twice a day and/or Miralax (over the counter) for constipation as needed.  Drink plenty of fluids (prune juice may be helpful) and high fiber foods.  ?Itching:  If you experience itching with your medications, try taking only a single pain pill, or even half a pain pill at a time.  You can also use benadryl over the counter for itching or also to help with sleep.  ?Precautions:  If you experience chest pain or shortness of breath - call 911 immediately for transfer to  the hospital emergency department!! ? ?PLEASE REFER TO TWO-SIDED PAPER INSTRUCTIONS IN PAPER CHART FOR SPECIFIC INSTRUCTIONS!!! ? ?If you develop a fever greater that 101.1 deg F, purulent drainage from wound, increased redness or drainage from wound, or calf pain -- Call the office at [phone] ? ? ?Post Anesthesia Home Care Instructions ? ?Activity: ?Get plenty of rest for the remainder of the day. A responsible individual must stay with you for 24 hours following the procedure.  ?For the next 24 hours, DO NOT: ?-Drive a car ?-Paediatric nurse ?-Drink alcoholic beverages ?-Take any medication unless instructed by your physician ?-Make any legal decisions or sign important papers. ? ?Meals: ?Start with liquid foods such as gelatin or soup. Progress to regular foods as tolerated. Avoid greasy, spicy, heavy foods. If nausea and/or vomiting occur, drink only clear liquids until the nausea and/or vomiting subsides. Call your physician if vomiting continues. ? ?Special Instructions/Symptoms: ?Your throat may feel dry or sore from the anesthesia or the breathing tube placed in your throat during surgery. If this causes discomfort, gargle with warm salt water. The discomfort should disappear within 24 hours. ? ?If you had a scopolamine patch placed behind your ear for the management of post- operative nausea and/or vomiting: ? ?1. The medication in the patch is effective for 72 hours, after which it should be removed.  Wrap patch in a tissue and discard in the trash. Wash hands thoroughly with soap and water. ?2. You may remove the patch earlier than 72 hours if you experience unpleasant side effects which may include dry mouth, dizziness or visual disturbances. ?3. Avoid touching  the patch. Wash your hands with soap and water after contact with the patch. ?    ? ?Regional Anesthesia Blocks ? ?1. Numbness or the inability to move the "blocked" extremity may last from 3-48 hours after placement. The length of time  depends on the medication injected and your individual response to the medication. If the numbness is not going away after 48 hours, call your surgeon. ? ?2. The extremity that is blocked will need to be protected until the numbness is gone and the  Strength has returned. Because you cannot feel it, you will need to take extra care to avoid injury. Because it may be weak, you may have difficulty moving it or using it. You may not know what position it is in without looking at it while the block is in effect. ? ?3. For blocks in the legs and feet, returning to weight bearing and walking needs to be done carefully. You will need to wait until the numbness is entirely gone and the strength has returned. You should be able to move your leg and foot normally before you try and bear weight or walk. You will need someone to be with you when you first try to ensure you do not fall and possibly risk injury. ? ?4. Bruising and tenderness at the needle site are common side effects and will resolve in a few days. ? ?5. Persistent numbness or new problems with movement should be communicated to the surgeon or the [place] [phone] [place] [phone]).  ? ?Information for Discharge Teaching: ?EXPAREL (bupivacaine liposome injectable suspension)  ? ?Your surgeon or anesthesiologist gave you EXPAREL(bupivacaine) to help control your pain after surgery.  ?EXPAREL is a local anesthetic that provides pain relief by numbing the tissue around the surgical site. ?EXPAREL is designed to release pain medication over time and can control pain for up to 72 hours. ?Depending on how you respond to EXPAREL, you may require less pain medication during your recovery. ? ?Possible side effects: ?Temporary loss of sensation or ability to move in the area where bupivacaine was injected. ?Nausea, vomiting, constipation ?Rarely, numbness and tingling in your mouth or lips, lightheadedness, or anxiety may  occur. ?Call your doctor right away if you think you may be experiencing any of these sensations, or if you have other questions regarding possible side effects. ? ?Follow all other discharge instructions given to you by your surgeon or nurse. Eat a healthy diet and drink plenty of water or other fluids. ? ?If you return to the hospital for any reason within 96 hours following the administration of EXPAREL, it is important for health care providers to know that you have received this anesthetic. A teal colored band has been placed on your arm with the date, time and amount of EXPAREL you have received in order to alert and inform your health care providers. Please leave this armband in place for the full 96 hours following administration, and then you may remove the band.  ? ? ?

## 2021-09-20 NOTE — Anesthesia Procedure Notes (Signed)
Procedure Name: Intubation ?Date/Time: 09/20/2021 12:53 PM ?Performed by: Lavonia Dana, CRNA ?Pre-anesthesia Checklist: Patient identified, Emergency Drugs available, Suction available and Patient being monitored ?Patient Re-evaluated:Patient Re-evaluated prior to induction ?Oxygen Delivery Method: Circle system utilized ?Preoxygenation: Pre-oxygenation with 100% oxygen ?Induction Type: IV induction ?Ventilation: Mask ventilation without difficulty ?Laryngoscope Size: Mac and 3 ?Grade View: Grade I ?Tube type: Oral ?Number of attempts: 1 ?Airway Equipment and Method: Stylet and Bite block ?Placement Confirmation: ETT inserted through vocal cords under direct vision, positive ETCO2 and breath sounds checked- equal and bilateral ?Secured at: 22 (At teeth) cm ?Tube secured with: Tape ?Dental Injury: Teeth and Oropharynx as per pre-operative assessment  ? ? ? ? ?

## 2021-09-20 NOTE — Progress Notes (Signed)
Assisted Dr. Annye Asa with right, supraclavicular, ultrasound guided block. Side rails up, monitors on throughout procedure. See vital signs in flow sheet. Tolerated Procedure well. ?

## 2021-09-20 NOTE — Anesthesia Preprocedure Evaluation (Addendum)
Anesthesia Evaluation  ?Patient identified by MRN, date of birth, ID band ?Patient awake ? ? ? ?Reviewed: ?Allergy & Precautions, NPO status , Patient's Chart, lab work & pertinent test results ? ?History of Anesthesia Complications ?Negative for: history of anesthetic complications ? ?Airway ?Mallampati: II ? ?TM Distance: >3 FB ?Neck ROM: Full ? ? ? Dental ? ?(+) Dental Advisory Given ?  ?Pulmonary ?neg pulmonary ROS,  ?  ?breath sounds clear to auscultation ? ? ? ? ? ? Cardiovascular ?hypertension, Pt. on medications ?(-) angina ?Rhythm:Regular Rate:Normal ? ?'21 ECHO: normal LVF, EF >60%, trivial AI, trivial TR ?  ?Neuro/Psych ?negative neurological ROS ?   ? GI/Hepatic ?Neg liver ROS, neg GERD  ,S/p roux-en-Y gastric bypass ?  ?Endo/Other  ?obese ? Renal/GU ?negative Renal ROS  ? ?  ?Musculoskeletal ? ? Abdominal ?(+) + obese,   ?Peds ? Hematology ?negative hematology ROS ?(+)   ?Anesthesia Other Findings ? ? Reproductive/Obstetrics ? ?  ? ? ? ? ? ? ? ? ? ? ? ? ? ?  ?  ? ? ? ? ? ? ? ?Anesthesia Physical ?Anesthesia Plan ? ?ASA: 2 ? ?Anesthesia Plan: General  ? ?Post-op Pain Management: Regional block* and Tylenol PO (pre-op)*  ? ?Induction: Intravenous ? ?PONV Risk Score and Plan: 3 and Ondansetron, Dexamethasone, Scopolamine patch - Pre-op and Treatment may vary due to age or medical condition ? ?Airway Management Planned: Oral ETT ? ?Additional Equipment: None ? ?Intra-op Plan:  ? ?Post-operative Plan: Extubation in OR ? ?Informed Consent: I have reviewed the patients History and Physical, chart, labs and discussed the procedure including the risks, benefits and alternatives for the proposed anesthesia with the patient or authorized representative who has indicated his/her understanding and acceptance.  ? ? ? ?Dental advisory given ? ?Plan Discussed with: CRNA and Surgeon ? ?Anesthesia Plan Comments: (Plan routine monitors, GETA with supraclav block for post op analgeisa)   ? ? ? ? ? ?Anesthesia Quick Evaluation ? ?

## 2021-09-20 NOTE — H&P (Signed)
PREOPERATIVE H&P ? ?HPI: ?[name] [name] is a 71 y.o. female who has presented today for surgery, with the diagnosis of right intra-articular olecranon fracture.  The various methods of treatment have been discussed with the patient and family.  After consideration of risks, benefits, and other options for treatment, the patient has consented to OPEN REDUCTION INTERNAL FIXATION (ORIF) RIGHT OLECRANON FRACTURE as a surgical intervention.  The patient's history has been reviewed, patient examined, no change in status, stable for surgery.  I have reviewed the patient's chart and labs.  Questions were answered to the patient's satisfaction.   ? ?PMH: ?Past Medical History:  ?Diagnosis Date  ? Arthritis   ? feet,knees,hands  ? Cancer [facility])   ? melanoma  ? Chronic anxiety   ? Family history of breast cancer   ? Family history of melanoma   ? Family history of pancreatic cancer   ? Hyperlipidemia   ? Hypertension   ? Obesity   ? Personal history of malignant melanoma   ? ? ?Home Medications Allergies  ?No current facility-administered medications on file prior to encounter.  ? ?Current Outpatient Medications on File Prior to Encounter  ?Medication Sig Dispense Refill  ? Ascorbic Acid (VITAMIN C PO) Take 500 mg by mouth 2 (two) times daily.    ? atorvastatin (LIPITOR) 10 MG tablet Take 10 mg by mouth daily.    ? calcium-vitamin D (OSCAL WITH D) 500-200 MG-UNIT tablet Take 1 tablet by mouth daily.    ? ferrous sulfate 325 (65 FE) MG tablet Take 325 mg by mouth in the morning and at bedtime.    ? FLUoxetine (PROZAC) 40 MG capsule Take 20 mg by mouth daily.     ? hydrochlorothiazide (MICROZIDE) 12.5 MG capsule Take 12.5 mg by mouth daily.    ? losartan (COZAAR) 50 MG tablet Take 50 mg by mouth daily.    ? traMADol (ULTRAM) 50 MG tablet Take by mouth every 6 (six) hours as needed.    ? zolpidem (AMBIEN) 10 MG tablet Take 10 mg by mouth at bedtime as needed.    ? ASPIRIN 81 PO Take by mouth daily.    ? losartan (COZAAR) 50 MG  tablet Take 50 mg by mouth 2 (two) times daily.    ? Allergies  ?Allergen Reactions  ? Codeine Nausea Only  ?  ? ?PSH: ?Past Surgical History:  ?Procedure Laterality Date  ? ABDOMINAL HYSTERECTOMY    ? APPENDECTOMY  1978  ? BREAST EXCISIONAL BIOPSY Right   ? BREAST LUMPECTOMY WITH RADIOACTIVE SEED LOCALIZATION Right 07/10/2016  ? Procedure: RIGHT BREAST LUMPECTOMY WITH RADIOACTIVE SEED LOCALIZATION;  Surgeon: Autumn Messing III, MD;  Location: [facility];  Service: General;  Laterality: Right;  ? GASTRIC ROUX-EN-Y  03/09/2012  ? Procedure: LAPAROSCOPIC ROUX-EN-Y GASTRIC;  Surgeon: Gayland Curry, MD,FACS;  Location: [facility];  Service: General;  Laterality: N/A;  ? low back  2010  ? x3  ? right ureter surgery    ? in college  ? UPPER GI ENDOSCOPY  03/09/2012  ? Procedure: UPPER GI ENDOSCOPY;  Surgeon: Gayland Curry, MD,FACS;  Location: [facility];  Service: General;  Laterality: N/A;  ?  ? ?Family History Social History  ?Family History  ?Problem Relation Age of Onset  ? Lung cancer Mother   ? Cancer Mother   ?     lung  ? Heart disease Father   ? HIV Brother   ? Cancer Paternal Aunt 29  ?  pancreatic cancer   ? Breast cancer Paternal Aunt   ? Cancer Maternal Grandmother   ?     liver cancer   ? Cancer Paternal Aunt 81  ?     breast cancer  ? Heart attack Maternal Grandfather   ? Congestive Heart Failure Paternal Grandmother   ? Stroke Paternal Grandfather   ? Melanoma Son 74  ?  Social History  ? ?Socioeconomic History  ? Marital status: Married  ?  Spouse name: Not on file  ? Number of children: 2  ? Years of education: Not on file  ? Highest education level: Not on file  ?Occupational History  ? Occupation: retired Pharmacist, hospital  ?Tobacco Use  ? Smoking status: Never  ? Smokeless tobacco: Never  ?Vaping Use  ? Vaping Use: Never used  ?Substance and Sexual Activity  ? Alcohol use: Yes  ?  Alcohol/week: 1.0 - 2.0 standard drink  ?  Types: 1 - 2 Glasses of wine per week  ?  Comment: 1-2 glass winn daily   ? Drug use:  No  ? Sexual activity: Not on file  ?Other Topics Concern  ? Not on file  ?Social History Narrative  ? Not on file  ? ?Social Determinants of Health  ? ?Financial Resource Strain: Not on file  ?Food Insecurity: Not on file  ?Transportation Needs: Not on file  ?Physical Activity: Not on file  ?Stress: Not on file  ?Social Connections: Not on file  ?  ? ?Review of Systems: ?MSK: As noted per HPI above ?GI: No current Nausea/vomiting ?ENT: Denies sore throat, epistaxis ?CV: Denies chest pain ?Resp: No current shortness of breath ? ?Other than mentioned above, there are no Constitutional, Neurological, Psychiatric, ENT, Ophthalmological, Cardiovascular, Respiratory, GI, GU, Musculoskeletal, Integumentary, Lymphatic, Endocrine or Allergic issues.  ? ?Physical Examination: ?CV: Normal distal pulses ?Lungs: Unlabored respirations ?RUE: Unfortunately block was performed by anesthesia prior to examination.  Neurovascular evaluation in the preoperative area not possible due to block.  Skin is intact and benign.  There is swelling and ecchymosis surrounding the elbow and proximal forearm.  Normal distal pulses.  Warm and well-perfused distally. ? ?Assessment/Plan: ?OPEN REDUCTION INTERNAL FIXATION (ORIF) RIGHT OLECRANON FRACTURE  ? ? ?Georgeanna Harrison M.D. ?Orthopaedic Surgery ?[facility] ? ?Review of this patient's medications prescribed by other providers does not in any way constitute an endorsement by this clinician of their use, indications, dosage, route, efficacy, interactions, or other clinical parameters. ? ?Portions of the record have been created with voice recognition software.  Grammatical and punctuation errors, random word insertions, wrong-word or "sound-a-like" substitutions, pronoun errors (inaccuracies and/or substitutions), and/or incomplete sentences may have occurred due to the inherent limitations of voice recognition software.  Not all errors are caught or corrected.   Although every attempt is made to root out erroneous and incomplete transcription, the note may still not fully represent the intent or opinion of the author.  Read the chart carefully and recognize, using context, where errors/substitutions have occurred.  Any questions or concerns about the content of this note or information contained within the body of this dictation should be addressed directly with the author for clarification.  ?

## 2021-09-20 NOTE — Transfer of Care (Signed)
Immediate Anesthesia Transfer of Care Note ? ?Patient: [name] [name] ? ?Procedure(s) Performed: OPEN REDUCTION INTERNAL FIXATION (ORIF) RIGHT OLECRANON FRACTURE (Right: Elbow) ? ?Patient Location: PACU ? ?Anesthesia Type:GA combined with regional for post-op pain ? ?Level of Consciousness: awake, alert  and oriented ? ?Airway & Oxygen Therapy: Patient Spontanous Breathing and Patient connected to face mask oxygen ? ?Post-op Assessment: Report given to RN and Post -op Vital signs reviewed and stable ? ?Post vital signs: Reviewed and stable ? ?Last Vitals:  ?Vitals Value Taken Time  ?BP 116/72 09/20/21 1658  ?Temp    ?Pulse 69 09/20/21 1658  ?Resp 14 09/20/21 1658  ?SpO2 99 % 09/20/21 1658  ?Vitals shown include unvalidated device data. ? ?Last Pain:  ?Vitals:  ? 09/20/21 1033  ?TempSrc: Oral  ?PainSc: 3   ?   ? ?Patients Stated Pain Goal: 3 (09/20/21 1033) ? ?Complications: No notable events documented. ?

## 2021-09-20 NOTE — Anesthesia Procedure Notes (Signed)
Anesthesia Regional Block: Supraclavicular block  ? ?Pre-Anesthetic Checklist: , timeout performed,  Correct Patient, Correct Site, Correct Laterality,  Correct Procedure, Correct Position, site marked,  Risks and benefits discussed,  Surgical consent,  Pre-op evaluation,  At surgeon's request and post-op pain management ? ?Laterality: Right and Upper ? ?Prep: chloraprep     ?  ?Needles:  ?Injection technique: Single-shot ? ?Needle Type: Echogenic Needle   ? ? ?Needle Length: 9cm  ?Needle Gauge: 21  ? ? ? ?Additional Needles: ? ? ?Procedures:,,,, ultrasound used (permanent image in chart),,    ?Narrative:  ?Start time: 09/20/2021 11:37 AM ?End time: 09/20/2021 11:43 AM ?Injection made incrementally with aspirations every 5 mL. ? ?Performed by: Personally  ?Anesthesiologist: Annye Asa, MD ? ?Additional Notes: ?Pt identified in Holding room.  Monitors applied. Working IV access confirmed. Sterile prep R clavicle and neck.  #21ga ECHOgenic Arrow block needle to supraclavicular brachial plexus with US guidance.  10cc 0.5% Bupivacaine with 1:200k epi, Exparel injected incrementally after negative test dose.  Patient asymptomatic, VSS, no heme aspirated, tolerated well.   Jenita Seashore, MD ? ? ? ? ?

## 2021-09-26 ENCOUNTER — Encounter (HOSPITAL_BASED_OUTPATIENT_CLINIC_OR_DEPARTMENT_OTHER): Payer: Self-pay | Admitting: Orthopedic Surgery

## 2022-01-15 ENCOUNTER — Ambulatory Visit
Admission: RE | Admit: 2022-01-15 | Discharge: 2022-01-15 | Disposition: A | Discharge status: Home / Self Care | Payer: Medicare PPO | Source: Ambulatory Visit | Attending: Gastroenterology | Admitting: Gastroenterology

## 2022-01-15 ENCOUNTER — Other Ambulatory Visit: Payer: Self-pay | Admitting: Gastroenterology

## 2022-01-15 DIAGNOSIS — R197 Diarrhea, unspecified: Secondary | ICD-10-CM

## 2022-01-15 DIAGNOSIS — R1033 Periumbilical pain: Secondary | ICD-10-CM

## 2022-01-15 MED ORDER — IOPAMIDOL (ISOVUE-300) INJECTION 61%
100.0000 mL | Freq: Once | INTRAVENOUS | Status: AC | PRN
  Administered 2022-01-15: 100 mL via INTRAVENOUS

## 2022-02-03 ENCOUNTER — Emergency Department (HOSPITAL_BASED_OUTPATIENT_CLINIC_OR_DEPARTMENT_OTHER): Payer: Medicare PPO | Admitting: Radiology

## 2022-02-03 ENCOUNTER — Emergency Department (HOSPITAL_BASED_OUTPATIENT_CLINIC_OR_DEPARTMENT_OTHER): Payer: Medicare PPO

## 2022-02-03 ENCOUNTER — Emergency Department (HOSPITAL_BASED_OUTPATIENT_CLINIC_OR_DEPARTMENT_OTHER)
Admission: EM | Admit: 2022-02-03 | Discharge: 2022-02-03 | Disposition: A | Discharge status: Home / Self Care | Payer: Medicare PPO | Attending: Emergency Medicine | Admitting: Emergency Medicine

## 2022-02-03 ENCOUNTER — Encounter (HOSPITAL_BASED_OUTPATIENT_CLINIC_OR_DEPARTMENT_OTHER): Payer: Self-pay

## 2022-02-03 ENCOUNTER — Other Ambulatory Visit: Payer: Self-pay

## 2022-02-03 DIAGNOSIS — S0012XA Contusion of left eyelid and periocular area, initial encounter: Secondary | ICD-10-CM | POA: Insufficient documentation

## 2022-02-03 DIAGNOSIS — R519 Headache, unspecified: Secondary | ICD-10-CM | POA: Diagnosis not present

## 2022-02-03 DIAGNOSIS — Y92002 Bathroom of unspecified non-institutional (private) residence single-family (private) house as the place of occurrence of the external cause: Secondary | ICD-10-CM | POA: Diagnosis not present

## 2022-02-03 DIAGNOSIS — W19XXXA Unspecified fall, initial encounter: Secondary | ICD-10-CM

## 2022-02-03 DIAGNOSIS — W01198A Fall on same level from slipping, tripping and stumbling with subsequent striking against other object, initial encounter: Secondary | ICD-10-CM | POA: Diagnosis not present

## 2022-02-03 DIAGNOSIS — S62656A Nondisplaced fracture of medial phalanx of right little finger, initial encounter for closed fracture: Secondary | ICD-10-CM | POA: Diagnosis not present

## 2022-02-03 DIAGNOSIS — M79641 Pain in right hand: Secondary | ICD-10-CM | POA: Diagnosis present

## 2022-02-03 DIAGNOSIS — S0011XA Contusion of right eyelid and periocular area, initial encounter: Secondary | ICD-10-CM | POA: Diagnosis not present

## 2022-02-03 DIAGNOSIS — T148XXA Other injury of unspecified body region, initial encounter: Secondary | ICD-10-CM

## 2022-02-03 DIAGNOSIS — Z7982 Long term (current) use of aspirin: Secondary | ICD-10-CM | POA: Insufficient documentation

## 2022-02-03 DIAGNOSIS — S62659A Nondisplaced fracture of medial phalanx of unspecified finger, initial encounter for closed fracture: Secondary | ICD-10-CM

## 2022-02-03 NOTE — ED Triage Notes (Signed)
Pt states she takes Ambien to sleep. She woke up at 0500 to use the restroom and slipped and fell.   Pt does not believe she lost consciousness.   Pt has two black eyes. Pt also has bruising to right hand.

## 2022-02-03 NOTE — ED Provider Notes (Signed)
[facility] Provider Note   CSN: [id number] Arrival date & time: 02/03/22  1404     History  Chief Complaint  Patient presents with   Fall    [name] [name] is a 71 y.o. female.  Patient presents today to the emergency department complaining of facial pain, headache, and right hand pain secondary to a fall.  Patient states that she was in her bathroom this morning at approximately 5 or 6 AM when she slipped on the floor and hit her face on the vanity.  She denies using blood thinners and denies losing consciousness.  She denies any vision changes, photophobia.  HPI     Home Medications Prior to Admission medications   Medication Sig Start Date End Date Taking? Authorizing Provider  Ascorbic Acid (VITAMIN C PO) Take 500 mg by mouth 2 (two) times daily.    [provider]  ASPIRIN 81 PO Take by mouth daily.    [provider]  atorvastatin (LIPITOR) 10 MG tablet Take 10 mg by mouth daily.    [provider]  buPROPion (WELLBUTRIN XL) 150 MG 24 hr tablet Take 150 mg by mouth daily. 12/13/21   [provider]  busPIRone (BUSPAR) 7.5 MG tablet Take by mouth. 12/17/21   [provider]  calcium-vitamin D (OSCAL WITH D) 500-200 MG-UNIT tablet Take 1 tablet by mouth daily.    [provider]  diclofenac (VOLTAREN) 75 MG EC tablet Take 75 mg by mouth 2 (two) times daily. 10/28/21   [provider]  ferrous sulfate 325 (65 FE) MG tablet Take 325 mg by mouth in the morning and at bedtime.    [provider]  FLUoxetine (PROZAC) 40 MG capsule Take 20 mg by mouth daily.     [provider]  hydrochlorothiazide (MICROZIDE) 12.5 MG capsule Take 12.5 mg by mouth daily.    [provider]  losartan (COZAAR) 50 MG tablet Take 50 mg by mouth daily.    [provider]  losartan (COZAAR) 50 MG tablet Take 50 mg by mouth 2 (two) times daily. 07/29/20   [provider]   oxyCODONE (ROXICODONE) 5 MG immediate release tablet Take 1 tablet (5 mg total) by mouth every 6 (six) hours as needed for severe pain or breakthrough pain (POSTOP PAIN). 09/20/21   Georgeanna Harrison, MD  promethazine (PHENERGAN) 12.5 MG tablet Take 1 tablet (12.5 mg total) by mouth every 6 (six) hours as needed for nausea or vomiting. 09/20/21   Georgeanna Harrison, MD  traZODone (DESYREL) 50 MG tablet Take 50 mg by mouth at bedtime. 01/09/22   [provider]  zolpidem (AMBIEN) 10 MG tablet Take 10 mg by mouth at bedtime as needed. 07/29/20   [provider]      Allergies    Codeine    Review of Systems   Review of Systems  Musculoskeletal:  Positive for arthralgias.  Skin:        Hematomas of face  Neurological:  Positive for headaches.    Physical Exam Updated Vital Signs BP 111/74 (BP Location: Left Arm)   Pulse 90   Temp 98.3 F (36.8 C) (Oral)   Resp 18   Ht '5\' 1"'$  (1.549 m)   Wt 76.2 kg   SpO2 100%   BMI 31.74 kg/m  Physical Exam Vitals and nursing note reviewed.  Constitutional:      General: She is not in acute distress.    Appearance: She is well-developed.  HENT:     Head: Normocephalic.     Comments: Large hematoma around left orbit, small hematoma noted to the lateral portion of right orbit Eyes:     Conjunctiva/sclera: Conjunctivae normal.     Pupils: Pupils are equal, round, and reactive to light.  Cardiovascular:     Rate and Rhythm: Normal rate and regular rhythm.     Heart sounds: No murmur heard. Pulmonary:     Effort: Pulmonary effort is normal. No respiratory distress.     Breath sounds: Normal breath sounds.  Abdominal:     Palpations: Abdomen is soft.     Tenderness: There is no abdominal tenderness.  Musculoskeletal:        General: Tenderness (Tenderness to palpation of middle phalanx of little finger of right hand) present. No swelling.     Cervical back: Neck supple.  Skin:    General: Skin is warm and dry.     Capillary  Refill: Capillary refill takes less than 2 seconds.     Findings: Bruising present.     Comments: Brisk cap refill in right little finger  Neurological:     General: No focal deficit present.     Mental Status: She is alert.  Psychiatric:        Mood and Affect: Mood normal.     ED Results / Procedures / Treatments   Labs (all labs ordered are listed, but only abnormal results are displayed) Labs Reviewed - No data to display  EKG None  Radiology CT HEAD WO CONTRAST (5MM)  Result Date: 02/03/2022 CLINICAL DATA:  Trauma, fall EXAM: CT HEAD WITHOUT CONTRAST TECHNIQUE: Contiguous axial images were obtained from the base of the skull through the vertex without intravenous contrast. RADIATION DOSE REDUCTION: This exam was performed according to the departmental dose-optimization program which includes automated exposure control, adjustment of the mA and/or kV according to patient size and/or use of iterative reconstruction technique. COMPARISON:  None Available. FINDINGS: Brain: No acute intracranial findings are seen. There are no signs of bleeding within the cranium. Ventricles are unremarkable. Cortical sulci are prominent. Vascular: Unremarkable. Skull: No fracture is seen. There is subcutaneous contusion/hematoma in both periorbital regions and right frontal scalp. Sinuses/Orbits: No acute findings are seen. Other: None. IMPRESSION: No acute intracranial findings are seen in noncontrast CT brain. Atrophy. No fracture is seen in calvarium. There is subcutaneous contusion/hematoma in both periorbital regions and right frontal scalp. Electronically Signed   By: Elmer Picker M.D.   On: 02/03/2022 16:41   CT Maxillofacial Wo Contrast  Result Date: 02/03/2022 CLINICAL DATA:  Trauma, fall EXAM: CT MAXILLOFACIAL WITHOUT CONTRAST TECHNIQUE: Multidetector CT imaging of the maxillofacial structures was performed. Multiplanar CT image reconstructions were also generated. RADIATION DOSE  REDUCTION: This exam was performed according to the departmental dose-optimization program which includes automated exposure control, adjustment of the mA and/or kV according to patient size and/or use of iterative reconstruction technique. COMPARISON:  None Available. FINDINGS: Osseous: No displaced fractures are seen. Orbits: Optic globes are symmetrical. Retrobulbar soft tissues are unremarkable. Sinuses: There are no air-fluid levels or significant mucosal thickening. Soft tissues: There is subcutaneous contusion/hematoma in periorbital regions on both sides, more so on the left side. Limited intracranial: Unremarkable. IMPRESSION: No recent fracture is seen. There are no air-fluid levels in paranasal sinuses. Optic globes and retrobulbar soft tissues are unremarkable. There is subcutaneous contusion/hematoma in both periorbital regions, more so on the left side. No opaque foreign bodies are seen. Electronically Signed  By: Elmer Picker M.D.   On: 02/03/2022 16:38   DG Hand Complete Right  Result Date: 02/03/2022 CLINICAL DATA:  Pain in fifth digit on RIGHT hand after a fall, bruising and swelling EXAM: RIGHT HAND - COMPLETE 3+ VIEW COMPARISON:  None Available. FINDINGS: Osseous demineralization. Advanced degenerative changes at first [facility] joint with joint space narrowing, spur formation and radial subluxation of the first metacarpal base. Scattered narrowing of IP joints RIGHT hand. Nondisplaced intra-articular fracture at base of middle phalanx RIGHT little finger with associated mild soft tissue swelling. No additional fracture, dislocation, or bone destruction. IMPRESSION: Nondisplaced intra-articular fracture, base of middle phalanx RIGHT little finger. Osseous demineralization with advanced degenerative changes and subluxation at the RIGHT first [facility] joint. Electronically Signed   By: Lavonia Dana M.D.   On: 02/03/2022 15:11    Procedures Procedures    Medications Ordered in ED Medications  - No data to display  ED Course/ Medical Decision Making/ A&P                           Medical Decision Making Amount and/or Complexity of Data Reviewed Radiology: ordered.   Patient presents to the hospital complaining of facial pain and right hand pain secondary to a fall.  Differential diagnosis includes but is not limited to intracranial abnormality, fracture, dislocation, soft tissue injury, and others  I reviewed the patient's past medical history.  Pertinent results include recent surgery by Dr. Mable Fill for ORIF for an elbow injury.  I saw no utility for labs at this time.  The patient was alert and oriented and remembers the fall.  There was no alteration of consciousness  I ordered and personally interpreted imaging including CT head without contrast, CT maxillofacial without contrast, and plain films of the right hand. No recent fracture is seen. There are no air-fluid levels in  paranasal sinuses. Optic globes and retrobulbar soft tissues are  unremarkable.    There is subcutaneous contusion/hematoma in both periorbital  regions, more so on the left side. No opaque foreign bodies are  seen.   No acute intracranial findings are seen in noncontrast CT brain.  Atrophy.    No fracture is seen in calvarium. There is subcutaneous  contusion/hematoma in both periorbital regions and right frontal  scalp.   Nondisplaced intra-articular fracture, base of middle phalanx RIGHT  little finger.    Osseous demineralization with advanced degenerative changes and  subluxation at the RIGHT first [facility] joint.  I agree with radiologist findings.  The patient's finger was placed in a splint and buddy taped.  The patient has previously seen Dr. Mable Fill for a fracture.  I recommended follow-up with him in 1 week for reevaluation of the middle phalanx right little finger fracture..  The patient states she will be out of the state at that time but plans to find an orthopedic provider in that  area for a follow-up appointment.  Thankfully no intracranial abnormality was noted and there were no facial fractures.  She may take over-the-counter medication as needed for pain control.  I recommend icing the hematomas as needed over the next few days.  Patient provided return precautions.  Discharged home       Final Clinical Impression(s) / ED Diagnoses Final diagnoses:  Closed nondisplaced fracture of middle phalanx of finger of right hand  Fall, initial encounter  Hematoma    Rx / DC Orders ED Discharge Orders     None  Dorothyann Peng, PA-C 02/03/22 [place], [place], DO 02/03/22 2006

## 2022-02-03 NOTE — Discharge Instructions (Addendum)
You were seen today after a fall.  Your work-up was reassuring for no signs of acute intracranial injury and for no signs of facial fracture.  I recommend using ice packs over the affected areas over the next few days.  He did have a nondisplaced fracture of the right little finger in the middle section.  This does have some joint involvement.  This has been splinted.  It is recommended that you see a hand specialist in 1 week for reevaluation. If you are out of the area please try to find an available provider.  Please use over-the-counter pain medication as needed.  Return to the emergency department as needed

## 2022-04-08 NOTE — Progress Notes (Signed)
Cardiology Office Note   Date:  04/11/2022   ID:  [name] [name], DOB 10-23-1950, MRN [id number]  PCP:  Aura Dials, PA-C  Cardiologist:   Tarick Parenteau [place], MD   Chief Complaint  Patient presents with   Hypertension   Hyperlipidemia       History of Present Illness:  [name] [name] is a 71 y.o. female who is seen for follow up HTN.  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 She has a history of HLD.. I had cared for her father Christie Nottingham for a number of years. She was evaluated in November of 2011 with a stress Cardiolite study which was normal. She also had an echocardiogram which demonstrated mild LVH and mild biatrial enlargement but was otherwise normal. She lives much of the year in the [place].   She notes she lost 100 lbs with her gastric bypass. She felt much better and was able initially to come off BP medication. Since then BP had increased and she was started on Ziac. BP was still high so we added losartan. Now she is on HCTZ instead of Ziac. BP at home has been well controlled.  Cholesterol is markedly improved.   She was in the [place]  in December 2021 and had an episode of acute memory loss. Lasted about 20 minutes. Went to the hospital. Was in NSR. MRI and MRA was negative. Echo was normal. She has followed up with Neuro here and recommended low dose ASA. Since then she notes some issues with memory loss. She has had a couple of falls as well. Fractured her elbow in May and had to have surgery. In September fell and hit face. CT of head negative. Denies any dizziness.    Past Medical History:  Diagnosis Date   Arthritis    feet,knees,hands   Cancer ([place])    melanoma   Chronic anxiety    Family history of breast cancer    Family history of melanoma    Family history of pancreatic cancer    Hyperlipidemia    Hypertension    Obesity    Personal history of malignant melanoma     Past Surgical History:  Procedure  Laterality Date   ABDOMINAL HYSTERECTOMY     APPENDECTOMY  1978   BREAST EXCISIONAL BIOPSY Right    BREAST LUMPECTOMY WITH RADIOACTIVE SEED LOCALIZATION Right 07/10/2016   Procedure: RIGHT BREAST LUMPECTOMY WITH RADIOACTIVE SEED LOCALIZATION;  Surgeon: Autumn Messing III, MD;  Location: [facility];  Service: General;  Laterality: Right;   GASTRIC ROUX-EN-Y  03/09/2012   Procedure: LAPAROSCOPIC ROUX-EN-Y GASTRIC;  Surgeon: Gayland Curry, MD,FACS;  Location: [facility];  Service: General;  Laterality: N/A;   low back  2010   x3   ORIF ELBOW FRACTURE Right 09/20/2021   Procedure: OPEN REDUCTION INTERNAL FIXATION (ORIF) RIGHT OLECRANON FRACTURE;  Surgeon: Georgeanna Harrison, MD;  Location: [facility];  Service: Orthopedics;  Laterality: Right;   right ureter surgery     in college   UPPER GI ENDOSCOPY  03/09/2012   Procedure: UPPER GI ENDOSCOPY;  Surgeon: Gayland Curry, MD,FACS;  Location: [facility];  Service: General;  Laterality: N/A;     Current Outpatient Medications  Medication Sig Dispense Refill   Ascorbic Acid (VITAMIN C PO) Take 500 mg by mouth 2 (two) times daily.     ASPIRIN 81 PO Take by mouth daily.     atorvastatin (  LIPITOR) 10 MG tablet Take 10 mg by mouth daily.     budesonide (ENTOCORT EC) 3 MG 24 hr capsule Take 9 mg by mouth daily.     buPROPion (WELLBUTRIN XL) 150 MG 24 hr tablet Take 150 mg by mouth daily.     calcium-vitamin D (OSCAL WITH D) 500-200 MG-UNIT tablet Take 1 tablet by mouth daily.     diclofenac (VOLTAREN) 75 MG EC tablet Take 75 mg by mouth as directed.     ferrous sulfate 325 (65 FE) MG tablet Take 325 mg by mouth in the morning and at bedtime.     FLUoxetine (PROZAC) 40 MG capsule Take 20 mg by mouth daily.      hydrochlorothiazide (MICROZIDE) 12.5 MG capsule Take 12.5 mg by mouth daily.     losartan (COZAAR) 50 MG tablet Take 50 mg by mouth daily.     losartan (COZAAR) 50 MG tablet Take 50 mg by mouth 2 (two) times daily.     zolpidem  (AMBIEN) 10 MG tablet Take 10 mg by mouth at bedtime as needed.     oxyCODONE (ROXICODONE) 5 MG immediate release tablet Take 1 tablet (5 mg total) by mouth every 6 (six) hours as needed for severe pain or breakthrough pain (POSTOP PAIN). (Patient not taking: Reported on 04/11/2022) 30 tablet 0   promethazine (PHENERGAN) 12.5 MG tablet Take 1 tablet (12.5 mg total) by mouth every 6 (six) hours as needed for nausea or vomiting. (Patient not taking: Reported on 04/11/2022) 30 tablet 0   traZODone (DESYREL) 50 MG tablet Take 50 mg by mouth at bedtime. (Patient not taking: Reported on 04/11/2022)     No current facility-administered medications for this visit.    Allergies:   Codeine    Social History:  The patient  reports that she has never smoked. She has never used smokeless tobacco. She reports current alcohol use of about 1.0 - 2.0 standard drink of alcohol per week. She reports that she does not use drugs.   Family History:  The patient's family history includes Breast cancer in her paternal aunt; Cancer in her maternal grandmother and mother; Cancer (age of onset: 61) in her paternal aunt and paternal aunt; Congestive Heart Failure in her paternal grandmother; HIV in her brother; Heart attack in her maternal grandfather; Heart disease in her father; Lung cancer in her mother; Melanoma (age of onset: 74) in her son; Stroke in her paternal grandfather.    ROS:  Please see the history of present illness.   Otherwise, review of systems are positive for none.   All other systems are reviewed and negative.    PHYSICAL EXAM: VS:  BP 128/76   Pulse 70   Ht '5\' 1"'$  (1.549 m)   Wt 147 lb (66.7 kg)   SpO2 98%   BMI 27.78 kg/m  , BMI Body mass index is 27.78 kg/m. GEN: Well nourished, well developed, in no acute distress  HEENT: normal  Neck: no JVD, carotid bruits, or masses Cardiac: RRR; no murmurs, rubs, or gallops,no edema  Respiratory:  clear to auscultation bilaterally, normal work of  breathing GI: soft, nontender, nondistended, + BS MS: no deformity or atrophy  Skin: warm and dry, no rash Neuro:  Strength and sensation are intact Psych: euthymic mood, full affect   EKG:  EKG is ordered today. The ekg ordered today demonstrates NSR with normal Ecg. Rate 65. I have personally reviewed and interpreted this study.    Recent Labs: 09/09/2021: BUN  18; Creatinine, Ser 0.68; Hemoglobin 12.1; Platelets 304; Potassium 3.3; Sodium 134    Lipid Panel    Component Value Date/Time   CHOL 242 (H) 07/29/2012 1121   TRIG 87 07/29/2012 1121   HDL 54 07/29/2012 1121   CHOLHDL 4.5 07/29/2012 1121   VLDL 17 07/29/2012 1121   LDLCALC 171 (H) 07/29/2012 1121    Labs dated 10/15/18: cholesterol 216, triglycerides 62, HDL 125, LDL 79. CMET normal.  Dated 08/28/20: cholesterol 187, triglycerides 59, HDL 96, LDL 80. CMET and CBC normal. Dated 12/30/21: cholesterol 213, triglycerides 61, HDL 124, LDL 78. A1c 5.4%. CBC, CMET, TSH normal  Wt Readings from Last 3 Encounters:  04/11/22 147 lb (66.7 kg)  02/03/22 168 lb (76.2 kg)  09/20/21 168 lb 10.4 oz (76.5 kg)      Other studies Reviewed: Additional studies/ records that were reviewed today include: Reviewed results of Echo and MRI from December. Scanned into record.    ASSESSMENT AND PLAN:  1.  HTN. Well controlled on combination of losartan and HCT.   2. Hypercholesterolemia. Well controlled on lipitor. 3. Morbid obesity. Excellent response to gastric bypasss.  4. Memory loss. Follow up with Neuro.    Current medicines are reviewed at length with the patient today.  The patient does not have concerns regarding medicines.  The following changes have been made:  See above  Labs/ tests ordered today include: none No orders of the defined types were placed in this encounter.     Disposition:   FU with me in 1 year  Signed, Ayerim Berquist [place], MD  04/11/2022 9:34 AM    [facility] [address],  [place], [place], [address] Phone [phone], Fax [phone]

## 2022-04-11 ENCOUNTER — Encounter: Payer: Self-pay | Admitting: Cardiology

## 2022-04-11 ENCOUNTER — Ambulatory Visit: Payer: Medicare PPO | Attending: Cardiology | Admitting: Cardiology

## 2022-04-11 VITALS — BP 128/76 | HR 70 | Ht 61.0 in | Wt 147.0 lb

## 2022-04-11 DIAGNOSIS — I10 Essential (primary) hypertension: Secondary | ICD-10-CM

## 2022-04-11 DIAGNOSIS — E7800 Pure hypercholesterolemia, unspecified: Secondary | ICD-10-CM | POA: Diagnosis not present

## 2022-06-23 ENCOUNTER — Ambulatory Visit: Payer: Medicare PPO | Admitting: Neurology

## 2022-06-23 ENCOUNTER — Encounter: Payer: Self-pay | Admitting: Neurology

## 2022-06-23 ENCOUNTER — Other Ambulatory Visit: Payer: Self-pay

## 2022-06-23 VITALS — BP 128/80 | HR 75 | Ht 61.0 in

## 2022-06-23 DIAGNOSIS — Z9884 Bariatric surgery status: Secondary | ICD-10-CM

## 2022-06-23 DIAGNOSIS — R6889 Other general symptoms and signs: Secondary | ICD-10-CM

## 2022-06-23 DIAGNOSIS — F5104 Psychophysiologic insomnia: Secondary | ICD-10-CM

## 2022-06-23 DIAGNOSIS — R296 Repeated falls: Secondary | ICD-10-CM | POA: Diagnosis not present

## 2022-06-23 DIAGNOSIS — R270 Ataxia, unspecified: Secondary | ICD-10-CM

## 2022-06-23 DIAGNOSIS — Z9181 History of falling: Secondary | ICD-10-CM | POA: Insufficient documentation

## 2022-06-23 NOTE — Progress Notes (Signed)
SLEEP MEDICINE CLINIC    Provider:  Larey Seat, MD  Primary Care Physician:  Aura Dials, PA-C [place] [id number]     Referring Provider: Aura Dials, [place],  [place] [address]          Chief Complaint according to patient   Patient presents with:     New Patient (Initial Visit): here with husband      Patient is concerned about memory decline over the last 2 years, her husband thinks it may be later. She also had 2 falls during the Summer 23  in one she fell and hit her face, and she feels this exacerbated her memory loss. She fell each time under the influence of ambien and had some wine  - and is still on Ambien.      HISTORY OF PRESENT ILLNESS:  [name] [name] is a 72 y.o. female patient who is seen upon referral on 06/23/2022 from [name], [place]  for a  memory evaluation.  Chief concern according to patient :  " She started to notice the memory decline after falls" .    I have the pleasure of seeing [name] [name] 06/23/22 a right-handed, married  female with a possible memory disorder.   [name] describes that she has sometimes taken a different turn on the road coming from a new address of a place that she may not be familiar with that it has been more difficult for her to reorient herself.  When she drives she is using a navigation system and that has helped.    She has for years used Ambien to help her sleep and she is very fearful of discontinuing the medication due to rebound insomnia.  Apparently trazodone and BuSpar were tried on Remeron and have not helped and replacing Ambien's effect.  Not successfully. Has been on Ambien or Lunesta and other prescription sleep aids for 20 years or more. The falls that she has prescribed were occurring during the nighttime when she was walking and she had CT of the head without contrast in September 2023 at the [facility] emergency room after she had  fallen on her face the right frontal scalp had a large hematoma and she had contusions and hematomas in both periorbital regions.  Considering that the patient is 41o years old, I would be concerned about a concussion. No nausea,  but she was dazed  PS : and did not remember the fall in May when she broke her elbow.  I also want to mention a TIA IN 12-03-2020 , the TIA was  on the [place] where she resides much of the year. On the [facility] she had a CT scan - read as normal.   She has back surgery in the past 83 years,and had gastric bypass surgery 6 years ago.     Family medical history:  other family member on CPAP with OSA, insomnia, sleep walkers.    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  Tobacco use; briefly .  ETOH use ; until summer 2023 1-2 glasses with dinner ,  Caffeine intake in form of Coffee( 1-2 cups ) Soda( /) Tea ( /) or energy  drinks Exercise in form of walking.  She used to be more active. She gave up Tennis out of fear of falling.       Sleep habits are as follows: The patient's dinner time is between 6 PM. The patient goes to bed at 9 PM and continues to sleep for 8 hours, wakes for 1 bathroom break.   The preferred sleep position is sideways, with the support of 1-2 pillows.  Dreams are reportedly frequent/vivid.   The patient wakes up spontaneously. 8-9  AM is the usual rise time. She reports not feeling refreshed or restored in AM, with symptoms such as dry mouth, morning headaches, and residual fatigue. Naps are taken daily frequently, lasting from 1-2 hours minutes and are more refreshing than nocturnal sleep.    Review of Systems: Out of a complete 14 system review, the patient complains of only the following symptoms, and all other reviewed systems are  negative.:  Fatigue, sleepiness , snoring, fragmented sleep, Insomnia, RLS, Nocturia    How likely are you to doze in the following situations: 0 = not likely, 1 = slight chance, 2 = moderate chance, 3 = high chance   Sitting and Reading? Watching Television? Sitting inactive in a public place (theater or meeting)? As a passenger in a car for an hour without a break? Lying down in the afternoon when circumstances permit? Sitting and talking to someone? Sitting quietly after lunch without alcohol? In a car, while stopped for a few minutes in traffic?   Total = X/ 24 points   FSS endorsed at X/ 63 points.   Memory testing ;     06/23/2022    2:13 PM  Montreal Cognitive Assessment   Visuospatial/ Executive (0/5) 5  Naming (0/3) 3  Attention: Read list of digits (0/2) 2  Attention: Read list of letters (0/1) 1  Attention: Serial 7 subtraction starting at 100 (0/3) 3  Language: Repeat phrase (0/2) 2  Language : Fluency (0/1) 1  Abstraction (0/2) 2  Delayed Recall (0/5) 3  Orientation (0/6) 6  Total 28     Social History   Socioeconomic History   Marital status: Married    Spouse name: Not on file   Number of children: 2   Years of education: Not on file   Highest education level: Not on file  Occupational History   Occupation: retired Pharmacist, hospital  Tobacco Use   Smoking status: Never   Smokeless tobacco: Never  Vaping Use   Vaping Use: Never used  Substance and Sexual Activity   Alcohol use: Yes    Alcohol/week: 1.0 - 2.0 standard drink of alcohol    Types: 1 - 2 Glasses of wine per week    Comment: 1-2 glass winn daily    Drug use: No   Sexual activity: Not on file  Other Topics Concern   Not on file  Social History Narrative   Not on file   Social Determinants of Health   Financial Resource Strain: Not on file  Food Insecurity: Not on file  Transportation Needs: Not on file  Physical Activity: Not on file  Stress: Not on file  Social Connections: Not on  file    Family History  Problem Relation Age of Onset   Lung cancer Mother    Cancer Mother        lung   Heart disease Father    HIV Brother    Cancer Paternal Aunt 40       pancreatic  cancer    Breast cancer Paternal Aunt    Cancer Maternal Grandmother        liver cancer    Cancer Paternal Aunt 28       breast cancer   Heart attack Maternal Grandfather    Congestive Heart Failure Paternal Grandmother    Stroke Paternal Grandfather    Melanoma Son 68    Past Medical History:  Diagnosis Date   Arthritis    feet,knees,hands   Cancer ([place])    melanoma   Chronic anxiety    Family history of breast cancer    Family history of melanoma    Family history of pancreatic cancer    Hyperlipidemia    Hypertension    Obesity    Personal history of malignant melanoma     Past Surgical History:  Procedure Laterality Date   ABDOMINAL HYSTERECTOMY     APPENDECTOMY  1978   BREAST EXCISIONAL BIOPSY Right    BREAST LUMPECTOMY WITH RADIOACTIVE SEED LOCALIZATION Right 07/10/2016   Procedure: RIGHT BREAST LUMPECTOMY WITH RADIOACTIVE SEED LOCALIZATION;  Surgeon: Autumn Messing III, MD;  Location: [facility];  Service: General;  Laterality: Right;   GASTRIC ROUX-EN-Y  03/09/2012   Procedure: LAPAROSCOPIC ROUX-EN-Y GASTRIC;  Surgeon: Gayland Curry, MD,FACS;  Location: [facility];  Service: General;  Laterality: N/A;   low back  2010   x3   ORIF ELBOW FRACTURE Right 09/20/2021   Procedure: OPEN REDUCTION INTERNAL FIXATION (ORIF) RIGHT OLECRANON FRACTURE;  Surgeon: Georgeanna Harrison, MD;  Location: [facility];  Service: Orthopedics;  Laterality: Right;   right ureter surgery     in college   UPPER GI ENDOSCOPY  03/09/2012   Procedure: UPPER GI ENDOSCOPY;  Surgeon: Gayland Curry, MD,FACS;  Location: [facility];  Service: General;  Laterality: N/A;     Current Outpatient Medications on File Prior to Visit  Medication Sig Dispense Refill   Ascorbic Acid (VITAMIN C PO) Take  500 mg by mouth 2 (two) times daily.     ASPIRIN 81 PO Take by mouth daily.     atorvastatin (LIPITOR) 10 MG tablet Take 10 mg by mouth daily.     budesonide (ENTOCORT EC) 3 MG 24 hr capsule Take 9 mg by mouth daily.     buPROPion (WELLBUTRIN XL) 150 MG 24 hr tablet Take 150 mg by mouth daily.     busPIRone (BUSPAR) 7.5 MG tablet Take 7.5 mg by mouth 3 (three) times daily as needed (anxiety).     calcium-vitamin D (OSCAL WITH D) 500-200 MG-UNIT tablet Take 1 tablet by mouth daily.     diclofenac (VOLTAREN) 75 MG EC tablet Take 75 mg by mouth as directed.     ferrous sulfate 325 (65 FE) MG tablet Take 325 mg by mouth in the morning and at bedtime.     FLUoxetine (PROZAC) 40 MG capsule Take 20 mg by mouth daily.      hydrochlorothiazide (MICROZIDE) 12.5 MG capsule Take 12.5 mg by mouth daily.     losartan (COZAAR) 50 MG tablet Take 50 mg by mouth daily.     zolpidem (AMBIEN) 10 MG tablet Take 10 mg by mouth at bedtime as needed.     No current facility-administered medications on file prior to visit.    Allergies  Allergen Reactions   Codeine Nausea Only     DIAGNOSTIC DATA (LABS, IMAGING, TESTING) - I reviewed patient records, labs, notes, testing and imaging myself where available.  Lab Results  Component Value Date   WBC 4.5 09/09/2021   HGB 12.1 09/09/2021   HCT 37.4 09/09/2021   MCV 94.2 09/09/2021   PLT 304 09/09/2021      Component Value Date/Time   NA 134 (L) 09/09/2021 1643   K 3.3 (L) 09/09/2021 1643   CL 100 09/09/2021 1643   CO2 24 09/09/2021 1643   GLUCOSE 96 09/09/2021 1643   BUN 18 09/09/2021 1643   CREATININE 0.68 09/09/2021 1643   CREATININE 0.88 07/29/2012 1121   CALCIUM 8.7 (L) 09/09/2021 1643   PROT 6.6 07/29/2012 1121   ALBUMIN 4.0 07/29/2012 1121   AST 20 07/29/2012 1121   ALT 27 07/29/2012 1121   ALKPHOS 73 07/29/2012 1121   BILITOT 0.4 07/29/2012 1121   GFRNONAA >60 09/09/2021 1643   GFRAA >60 07/07/2016 1217   Lab Results  Component Value  Date   CHOL 242 (H) 07/29/2012   HDL 54 07/29/2012   LDLCALC 171 (H) 07/29/2012   TRIG 87 07/29/2012   CHOLHDL 4.5 07/29/2012   No results found for: "HGBA1C" Lab Results  Component Value Date   VITAMINB12 366 01/28/2013   Lab Results  Component Value Date   TSH 2.297 01/21/2012     Ref Range & Units 1 mo ago   Glucose 70 - 99 mg/dL 87  BUN 8 - 27 mg/dL 19  Creatinine 0.57 - 1.00 mg/dL 0.92  eGFR >59 mL/min/1.73 67  BUN/Creatinine Ratio 12 - 28 21  Sodium 134 - 144 mmol/L 139  Potassium 3.5 - 5.2 mmol/L 3.8  Chloride 96 - 106 mmol/L 100  CO2 20 - 29 mmol/L 27  CALCIUM 8.7 - 10.3 mg/dL 9.8  Total Protein 6.0 - 8.5 g/dL 6.6  Albumin, Serum 3.9 - 4.9 g/dL 4.3  Globulin, Total 1.5 - 4.5 g/dL 2.3  Albumin/Globulin Ratio 1.2 - 2.2 1.9  Total Bilirubin 0.0 - 1.2 mg/dL <0.2  Alkaline Phosphatase 44 - 121 IU/L 82  AST 0 - 40 IU/L 38  ALT (SGPT) 0 - 32 IU/L 45 High   Resulting Agency  [facility]   PHYSICAL EXAM:  Today's Vitals   06/23/22 1403  BP: 128/80  Pulse: 75  Height: 5' 1"$  (1.549 m)   Body mass index is 27.78 kg/m.   Wt Readings from Last 3 Encounters:  04/11/22 147 lb (66.7 kg)  02/03/22 168 lb (76.2 kg)  09/20/21 168 lb 10.4 oz (76.5 kg)     Ht Readings from Last 3 Encounters:  06/23/22 5' 1"$  (1.549 m)  04/11/22 5' 1"$  (1.549 m)  02/03/22 5' 1"$  (1.549 m)      General: The patient is awake, alert and appears not in acute distress. The patient is well groomed. Head: Normocephalic, atraumatic. Neck is supple.  Mallampati 1,  neck circumference:13.5  inches . Nasal airflow  patent.  Retrognathia is not seen.  Dental status: biological  Cardiovascular:  Regular rate and cardiac rhythm by pulse,  without distended neck veins. Respiratory: Lungs are clear to auscultation.  Skin:  Without evidence of ankle edema, or rash. Trunk: The patient's posture is erect.   NEUROLOGIC EXAM: The patient is awake and alert, oriented to place and time.   Memory  subjective described as impaired -    06/23/2022    2:13 PM  Montreal Cognitive Assessment   Visuospatial/ Executive (0/5) 5  Naming (0/3) 3  Attention: Read list of digits (0/2) 2  Attention: Read list of letters (0/1) 1  Attention:  Serial 7 subtraction starting at 100 (0/3) 3  Language: Repeat phrase (0/2) 2  Language : Fluency (0/1) 1  Abstraction (0/2) 2  Delayed Recall (0/5) 3  Orientation (0/6) 6  Total 28   .  Attention span & concentration ability appears normal.  Speech is fluent,  without  dysarthria, dysphonia or aphasia.  Mood and affect are concerned, a bit jumpy or anxious.    Cranial nerves: no loss of smell or taste reported  Pupils are equal and briskly reactive to light. Funduscopic exam deferred.  Extraocular movements in vertical and horizontal planes were intact and without nystagmus. No Diplopia. Visual fields by finger perimetry are intact. Hearing was intact to soft voice and tuning fork.   Facial sensation intact to fine touch.  Facial motor strength is symmetric and tongue and uvula move midline.  Tongue trembling.  Neck ROM : rotation, tilt and flexion extension were normal for age and shoulder shrug was symmetrical.    Motor exam:  Symmetric bulk, tone and ROM.   Normal tone without cog-wheeling, symmetrically attenuated  grip strength-  .   Sensory:  Fine touch and vibration were felt at the ankle but sensation was decreased . Proprioception tested in the upper extremities was normal.   Coordination: Rapid alternating movements in the fingers/hands were of normal speed.  The Finger-to-nose maneuver was intact without evidence of ataxia, dysmetria or tremor.   Gait and station: Patient could rise unassisted from a seated position, walked without assistive device.  Stance is of normal width/ base and the patient turned with 3.5 steps.  Toe and heel walk were deferred.  Tandem gait was impaired, severely impaired  - ataxia.  Deep tendon reflexes: in  the upper and lower extremities are symmetric and intact.  Babinski response was deferred.   ASSESSMENT AND PLAN 72 y.o. year old female  here with: Referral for memory but the patient mentioned falls , is status post  gastric bypass, and has memory concerns.     1) Mrs. Mcguire's memory test by [facility] cognitive assessment was excellent.  28 out of 30 points.  This gives me no reason to be concerned about memory loss or dementia.  There is a minimal impairment by some tremor when she drew a copy but otherwise she was able to do multi steps and she recalled 3 out of 5 words which gave her the only 2 missing points for this exam this is still within normal limits and it would best be considered a mild short-term memory impairment. The CT scan that was obtained last year after a fall did not show any atrophy or at least no atrophy was mentioned.  The study was performed through [place].  2) the 2 last falls that led to a bony injury and to a frontal face injury verbose occurring at night and under the influence of Ambien. She also drank wine with that- we discussed the higher risk of falling in patients her age but her fear is to not sleep at all.  I would recommend to [facility] to reduce ambine to 50% of the current doe for 2 days a week and expand from there monthly until she is used to 50% each night.  She sleeps 9-10 hours a day anyway-   3)  ataxia- severe gait impairment  - tandem gait was extremely unsteady- I can see her fall risk is elevated by this. She is status post gastric bypass surgery about 10 years ago,  Gastric  bypass surgery does predispose the patient to neuropathy due to lack of absorption of certain trace elements.  It is actually hard to test for these elements we usually test for electrolytes especially iron and she is taking iron supplements.  I will make sure that this is not a B12 deficiency as well.  A B12 deficiency could also manifest with memory problems.  In  addition MRI brain and MRI cervical spine, looking for posterior spinal cord lesions ,    The couple will come back form [place] on March 10- 17th .   I plan to follow up either personally or through our NP within 3-5 months.   I would like to thank Selinda Orion and Aura Dials, [place],  [place] [address] for allowing me to meet with and to take care of this pleasant patient.   CC: I will share my notes with PCP .  After spending a total time of  55  minutes face to face and additional time for physical and neurologic examination, review of laboratory studies,  personal review of imaging studies, reports and results of other testing and review of referral information / records as far as provided in visit,   Electronically signed by: Larey Seat, MD 06/23/2022 2:40 PM  [facility] Board certified by The [employer] of Sleep Medicine and Diplomate of the [employer] of Sleep Medicine. Board certified In Neurology through the [place], Fellow of the [employer] of Neurology. Medical Director of [employer].

## 2022-06-23 NOTE — Patient Instructions (Signed)
1) Mrs. [name] [name]'s memory test by [facility] cognitive assessment was excellent.  28 out of 30 points.  This gives me no reason to be concerned about memory loss or dementia.  There is a minimal impairment by some tremor when she drew a copy but otherwise she was able to do multi steps and she recalled 3 out of 5 words which gave her the only 2 missing points for this exam this is still within normal limits and it would best be considered a mild short-term memory impairment. The CT scan that was obtained last year after a fall did not show any atrophy or at least no atrophy was mentioned.  The study was performed through [facility].   2) the 2 last falls that led to a bony injury and to a frontal face injury verbose occurring at night and under the influence of Ambien. She also drank wine with that- we discussed the higher risk of falling in patients her age but her fear is to not sleep at all.  I would recommend to [facility] to reduce ambine to 50% of the current doe for 2 days a week and expand from there monthly until she is used to 50% each night.  She sleeps 9-10 hours a day anyway-    3)  ataxia- severe gait impairment  - tandem gait was extremely unsteady- I can see her fall risk is elevated by this. 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  It is actually hard to test for these elements we usually test for electrolytes especially iron and she is taking iron supplements.  I will make sure that this is not a B12 deficiency as well.  A B12 deficiency could also manifest with memory problems.   In addition MRI brain and MRI cervical spine, looking for posterior spinal cord lesions ,      The couple will come back form [place] on March 10- 17th .

## 2022-06-23 NOTE — Addendum Note (Signed)
Addended by: Gertie Baron D on: 06/23/2022 04:37 PM   Modules accepted: Orders

## 2022-06-24 ENCOUNTER — Telehealth: Payer: Self-pay

## 2022-06-24 LAB — IRON,TIBC AND FERRITIN PANEL
Ferritin: 82 ng/mL (ref 15–150)
Iron Saturation: 20 % (ref 15–55)
Iron: 58 ug/dL (ref 27–139)
Total Iron Binding Capacity: 289 ug/dL (ref 250–450)
UIBC: 231 ug/dL (ref 118–369)

## 2022-06-24 LAB — VITAMIN B12: Vitamin B-12: 337 pg/mL (ref 232–1245)

## 2022-06-24 NOTE — Telephone Encounter (Signed)
-----   Message from Larey Seat, MD sent at 06/24/2022 12:54 PM EST ----- Low-Normal vitamin b 12 level.

## 2022-06-24 NOTE — Telephone Encounter (Signed)
Pt returned called, went over results. Advised to contact office back with any questions or concerns as she had none at this time and was appreciative.

## 2022-06-24 NOTE — Telephone Encounter (Signed)
Contacted pt, LVM rq CB  Labs were normal.

## 2022-07-01 ENCOUNTER — Telehealth: Payer: Self-pay | Admitting: Neurology

## 2022-07-01 NOTE — Telephone Encounter (Signed)
Craig Staggers: [id number] exp. 07/01/22-07/31/22 sent to GI [id number]

## 2022-08-03 ENCOUNTER — Inpatient Hospital Stay: Admission: RE | Admit: 2022-08-03 | Payer: Medicare PPO | Source: Ambulatory Visit

## 2022-08-29 ENCOUNTER — Ambulatory Visit
Admission: RE | Admit: 2022-08-29 | Discharge: 2022-08-29 | Disposition: A | Discharge status: Home / Self Care | Payer: Medicare PPO | Source: Ambulatory Visit | Attending: Neurology | Admitting: Neurology

## 2022-08-29 DIAGNOSIS — R270 Ataxia, unspecified: Secondary | ICD-10-CM

## 2022-08-29 DIAGNOSIS — Z9884 Bariatric surgery status: Secondary | ICD-10-CM

## 2022-08-29 MED ORDER — GADOPICLENOL 0.5 MMOL/ML IV SOLN
10.0000 mL | Freq: Once | INTRAVENOUS | Status: AC | PRN
  Administered 2022-08-29: 10 mL via INTRAVENOUS

## 2022-09-01 NOTE — Progress Notes (Signed)
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  These lacunes were likely silent.   No strokes, ischemia, and normal brain for age otherwise.

## 2022-09-02 ENCOUNTER — Telehealth: Payer: Self-pay

## 2022-09-02 NOTE — Telephone Encounter (Signed)
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

## 2022-09-02 NOTE — Telephone Encounter (Signed)
Contacted pt, informed her MRI of C Spine shows mild degenerative changes but no cord damage or impinged nerves. Also the MRI of brain was normal for her age, no strokes or ischemia.  Advised to call the office back with any questions or concerns she had none at this time.  She verbally understood and was appreciative.         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  These lacunes were likely silent.  No strokes, ischemia, and normal brain for age otherwise

## 2022-11-29 IMAGING — CT CT CERVICAL SPINE W/O CM
3 of 4 series · 13 of 33 positions shown, 16 images · non-contrast
Comparison: None.

CLINICAL DATA: Status post fall.



[Series 5: cor bone · coronal · 0.33mm/px · 3 of 69 slices shown]
[im 19/69  bone]
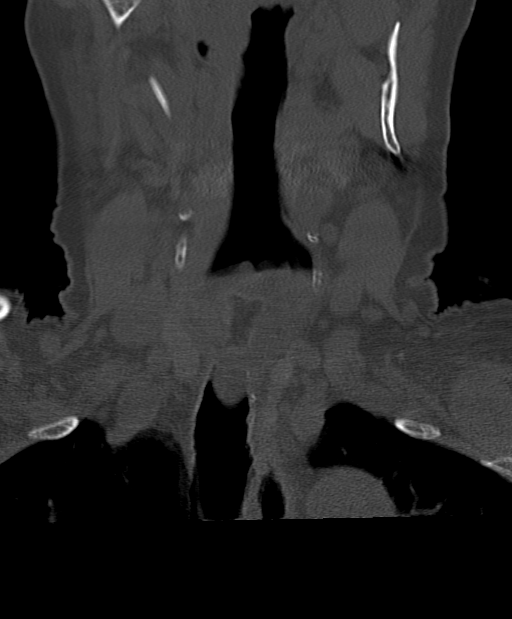
[im 29/69  bone]
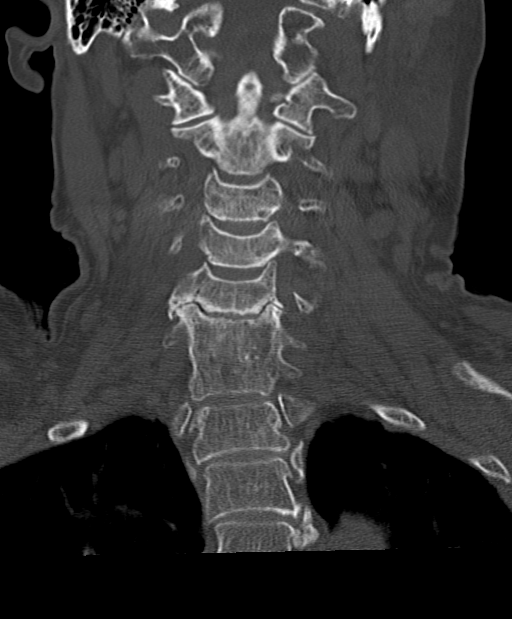
[im 40/69  bone]
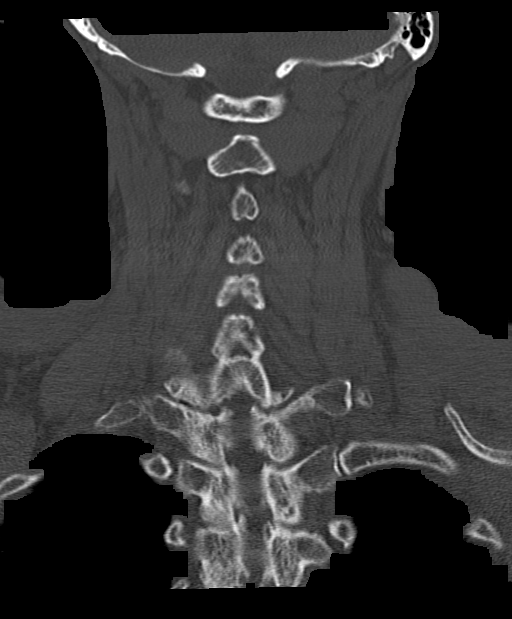

[Series 6: sag bone · sagittal · 0.27mm/px · 5 of 84 slices shown, 6 images]
[im 28/84  bone]
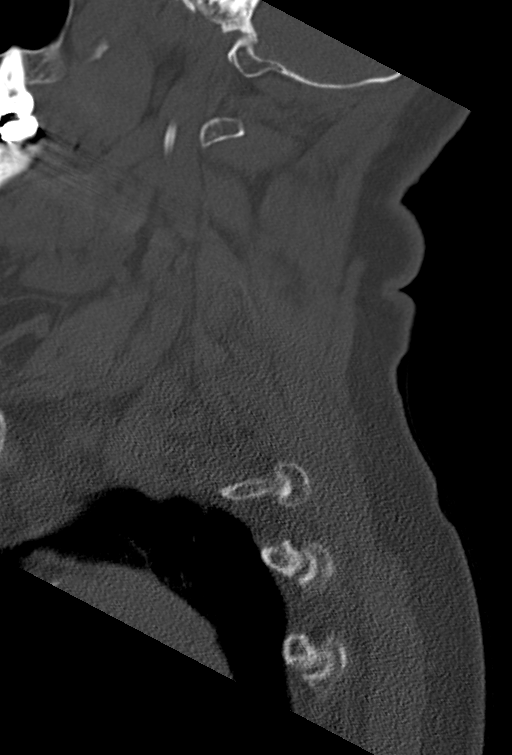
[im 35/84  bone]
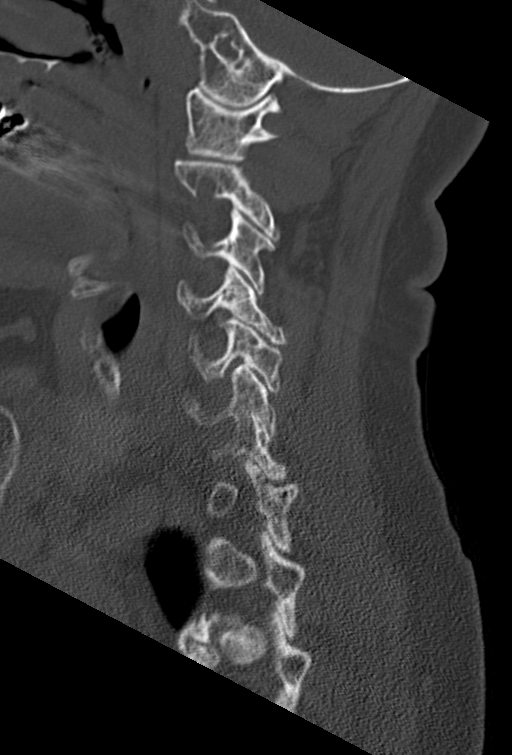
[im 42/84  soft-tissue]
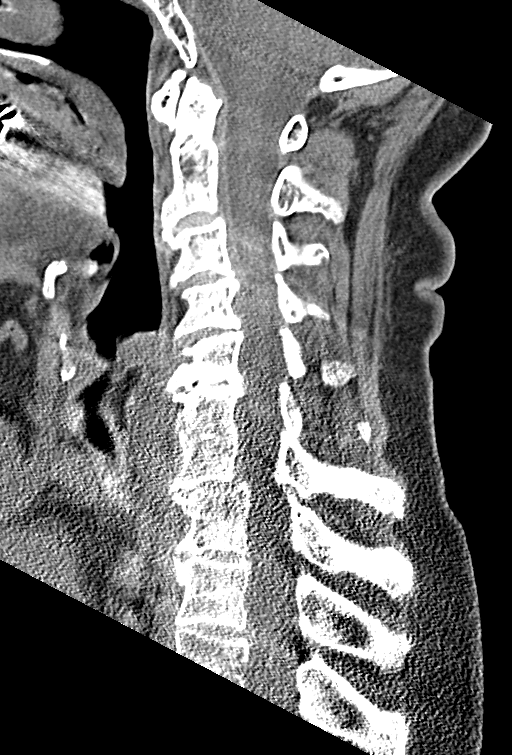
[im 42/84  bone]
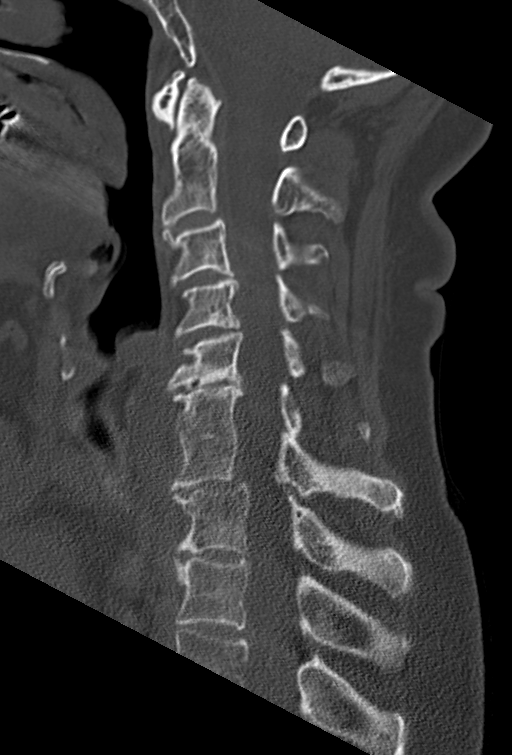
[im 49/84  bone]
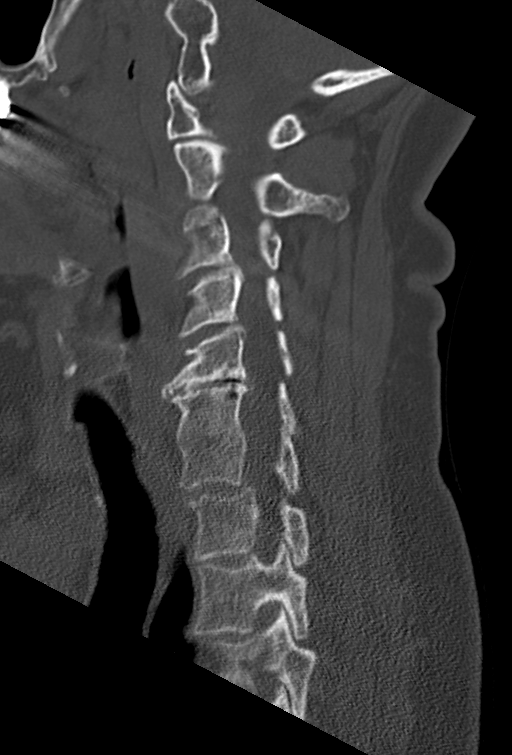
[im 56/84  bone]
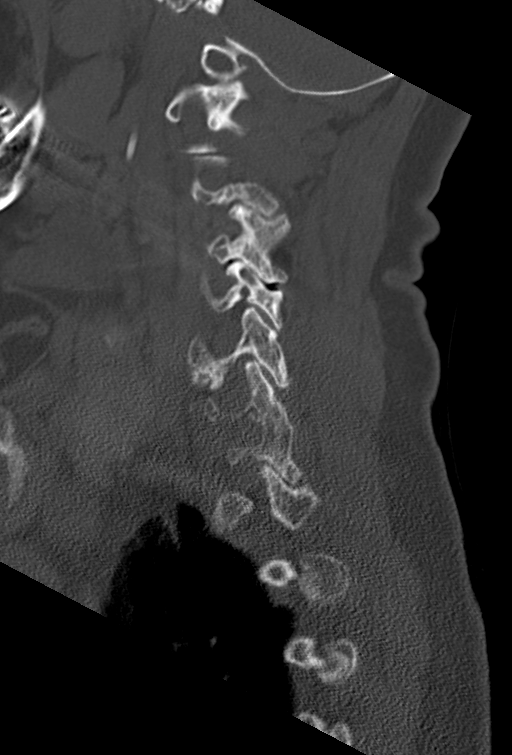

[Series 7: orthogonal axials · axial · 0.22mm/px · z∈[+462,+574]mm · 5 of 90 slices shown, 7 images]
[im 15/90  soft-tissue]
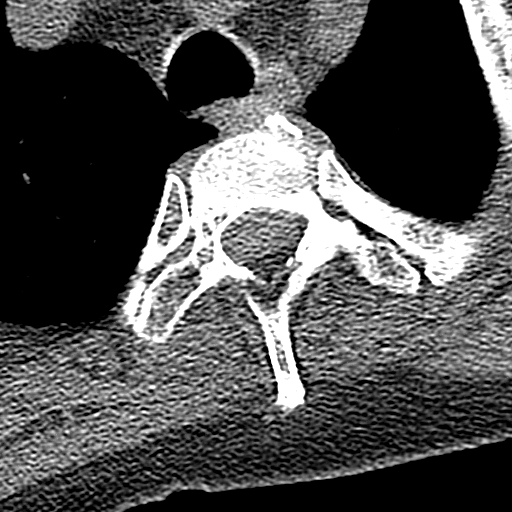
[im 15/90  bone]
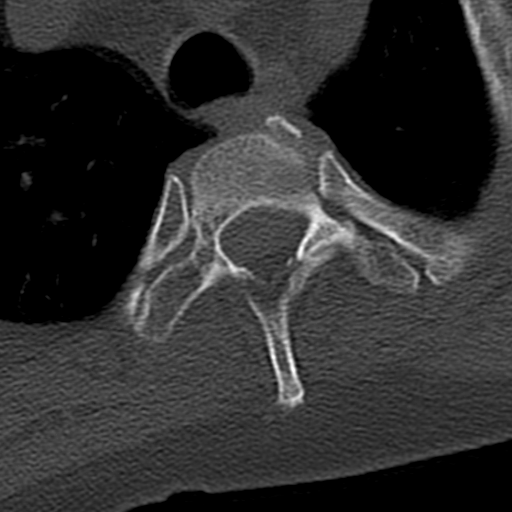
[im 30/90  bone]
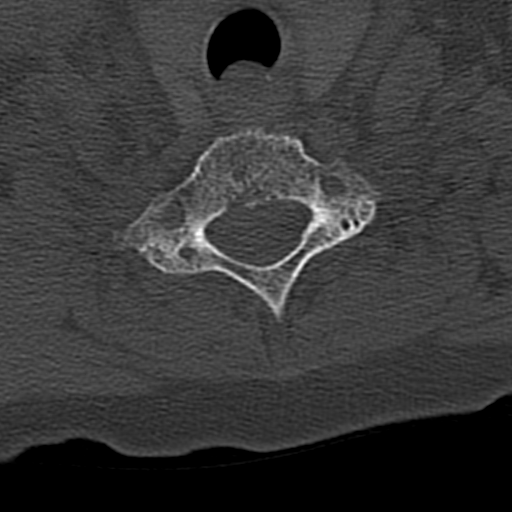
[im 45/90  bone]
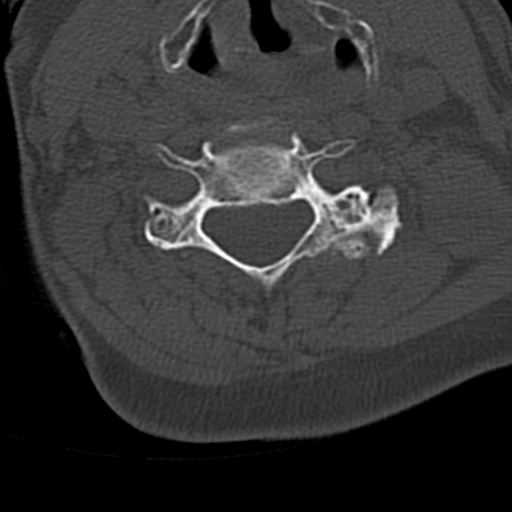
[im 60/90  bone]
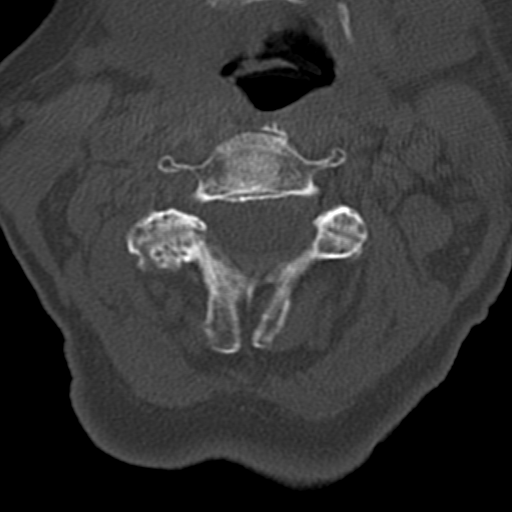
[im 75/90  soft-tissue]
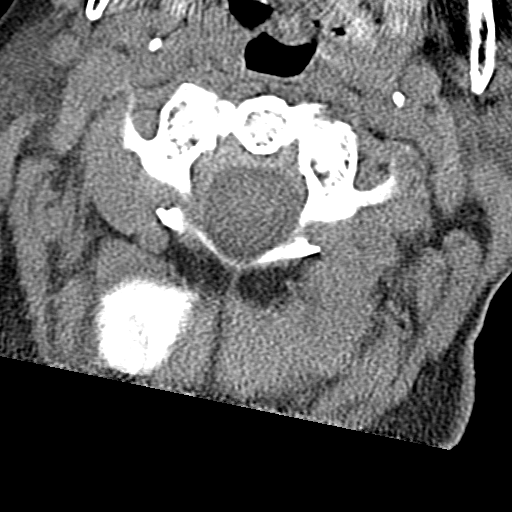
[im 75/90  bone]
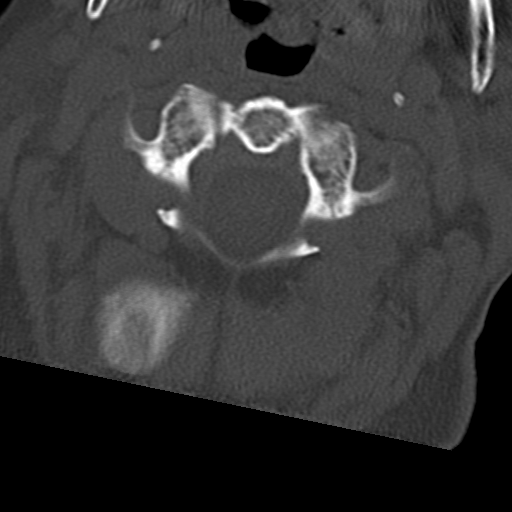

[13 of 33 positions shown; findings below may reference images not displayed]

FINDINGS: Alignment: There is straightening of the normal cervical spine
lordosis.

Skull base and vertebrae: No acute fracture. No primary bone lesion
or focal pathologic process.

Soft tissues and spinal canal: No prevertebral fluid or swelling. No
visible canal hematoma.

Disc levels: Marked severity endplate sclerosis and mild to moderate
severity anterior osteophyte formation is seen at the level of
C5-C6. Mild posterior bony spurring is seen at C3-C4, C4-C5 and
C5-C6.

There is fusion of the C6-C7 intervertebral disc space with marked
severity intervertebral disc space narrowing at the level of C5-C6.

Bilateral marked severity multilevel facet joint hypertrophy is
noted.

Upper chest: Negative.

Other: None.
IMPRESSION: 1. No acute fracture or subluxation of the cervical spine.
2. Multilevel degenerative changes, most prominent at the levels of
C5-C6 and C6-C7.
3. Fusion of the C6-C7 intervertebral disc space.

## 2022-11-29 IMAGING — CT CT ELBOW*R* W/O CM
5 of 7 series · 13 of 33 positions shown, 14 images · non-contrast
Comparison: Radiographs, same date.

CLINICAL DATA: Right elbow fracture.

EXAM:
CT OF THE UPPER RIGHT EXTREMITY WITHOUT CONTRAST
TECHNIQUE: Multidetector CT imaging of the upper right extremity was performed
according to the standard protocol.
RADIATION DOSE REDUCTION: This exam was performed according to the
departmental dose-optimization program which includes automated
exposure control, adjustment of the mA and/or kV according to
patient size and/or use of iterative reconstruction technique.

[Series 5: axial soft · axial · 0.41mm/px · z∈[+378,+468]mm · 4 of 76 slices shown]
[im 16/76  soft-tissue]
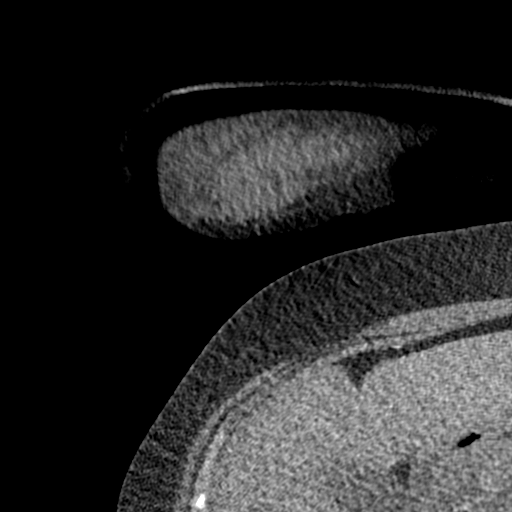
[im 31/76  soft-tissue]
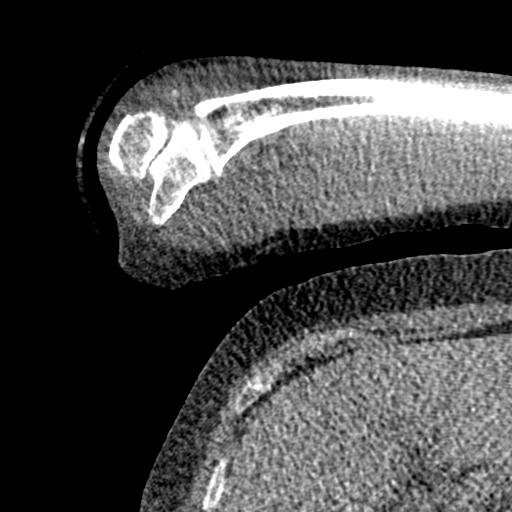
[im 46/76  soft-tissue]
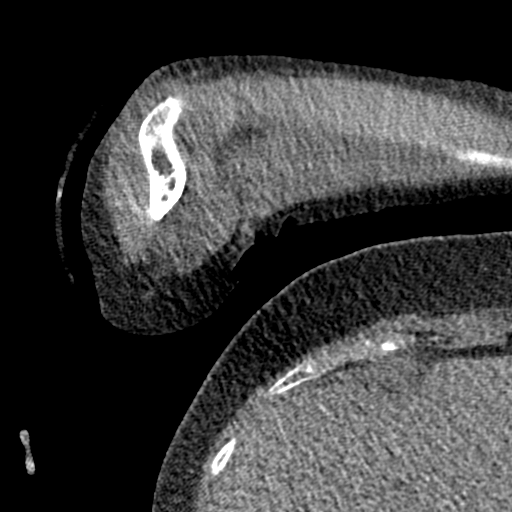
[im 61/76  soft-tissue]
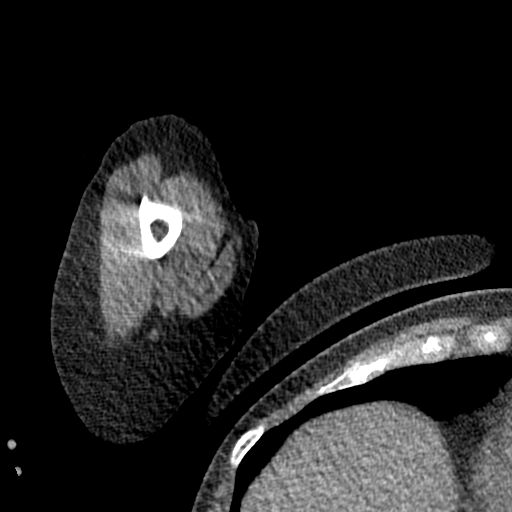

[Series 8: coronal soft · coronal · 0.29mm/px · 3 of 59 slices shown]
[im 16/59  bone]
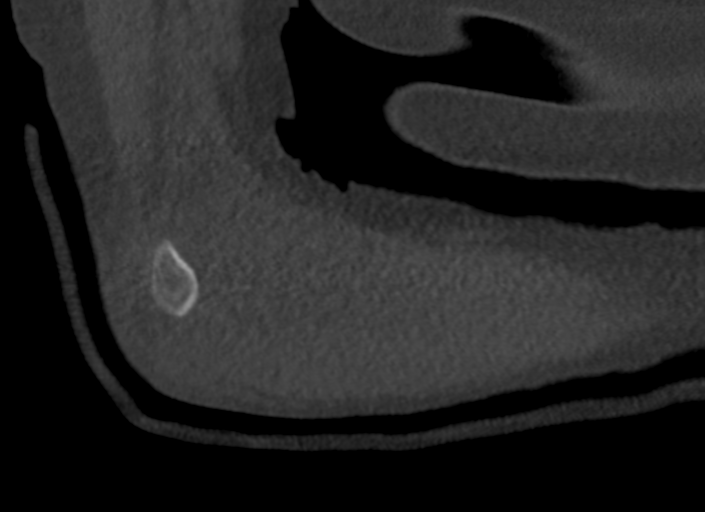
[im 25/59  bone]
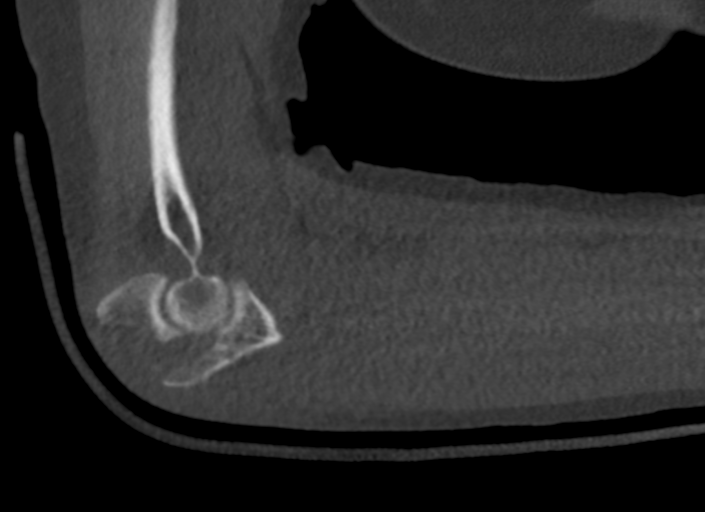
[im 34/59  bone]
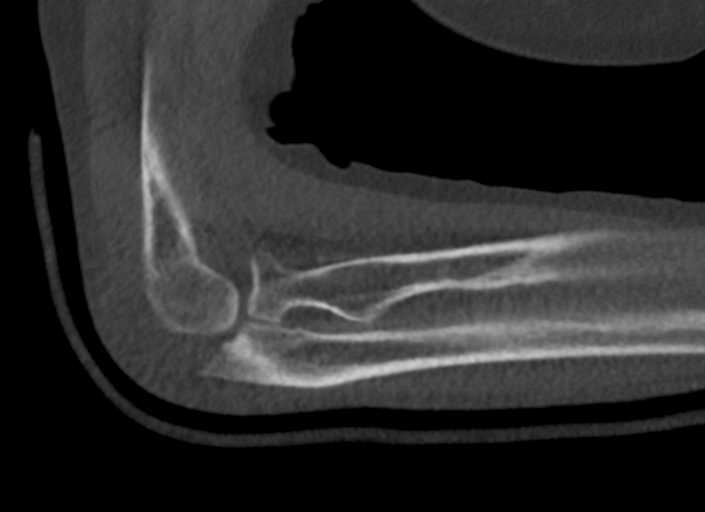

[Series 10: coronal (id) · axial · 0.29mm/px · z∈[+365,+403]mm · 2 of 59 slices shown, 3 images (1 of 2)]
[im 20/59  soft-tissue]
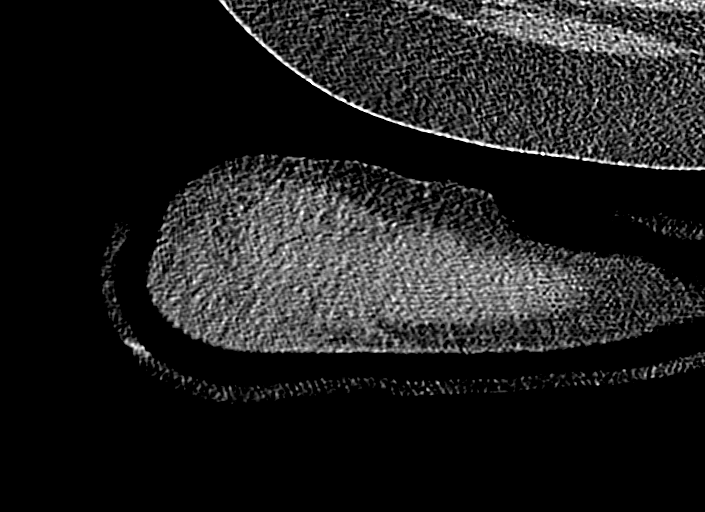
[im 20/59  bone]
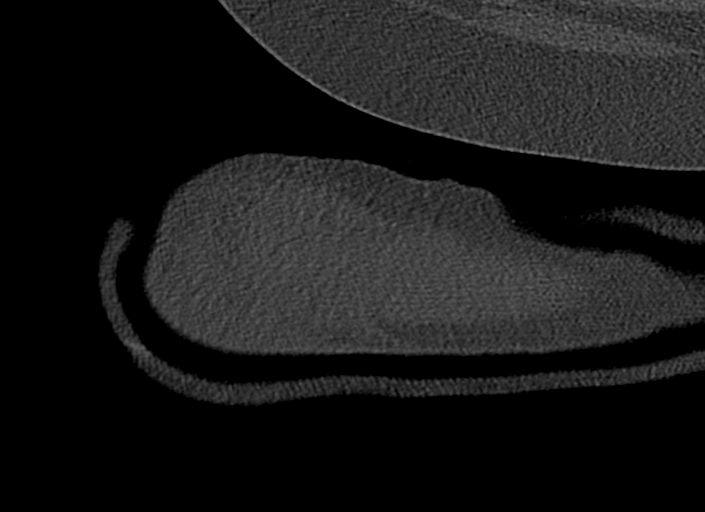
[im 39/59  bone]
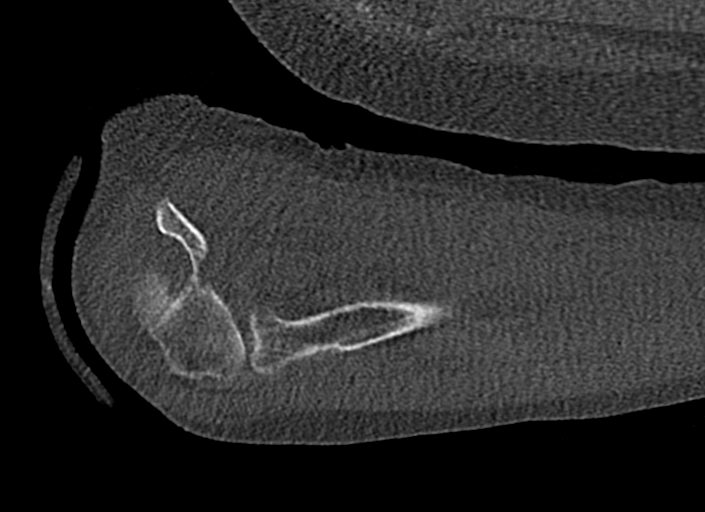

[Series 11: sagittal (id) · sagittal · 0.23mm/px · 2 of 105 slices shown]
[im 35/105  bone]
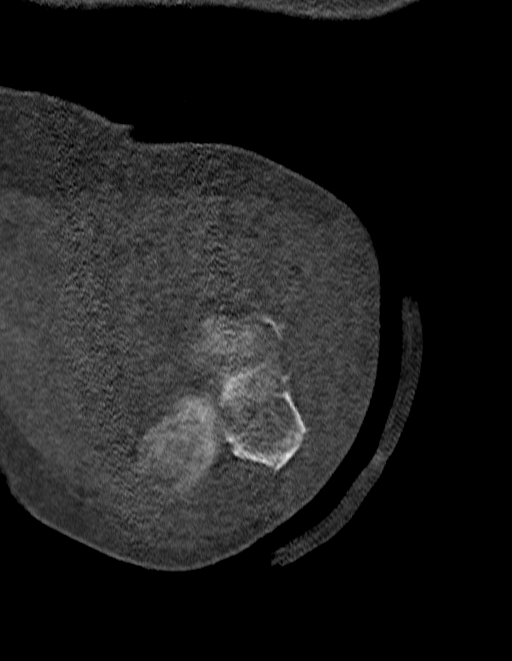
[im 70/105  bone]
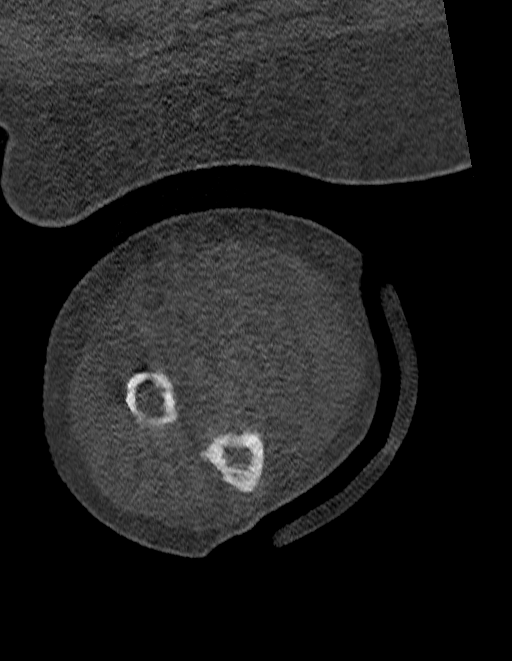

[Series 12: coronal (id) · axial · 0.29mm/px · z∈[+367,+404]mm · 2 of 59 slices shown (2 of 2)]
[im 20/59  bone]
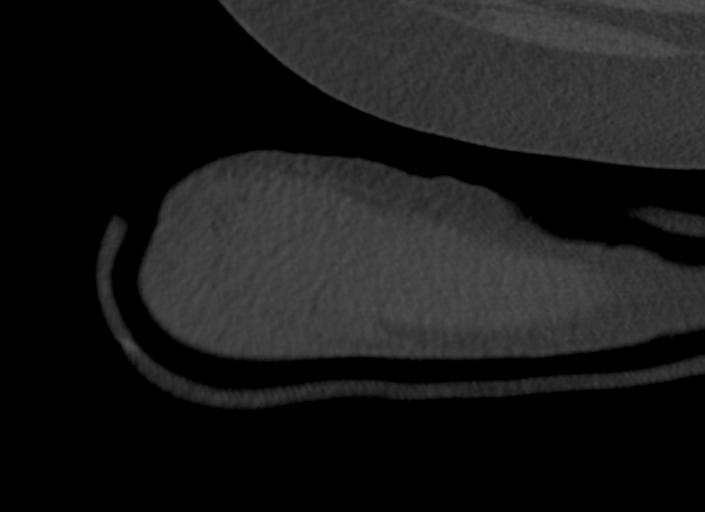
[im 39/59  bone]
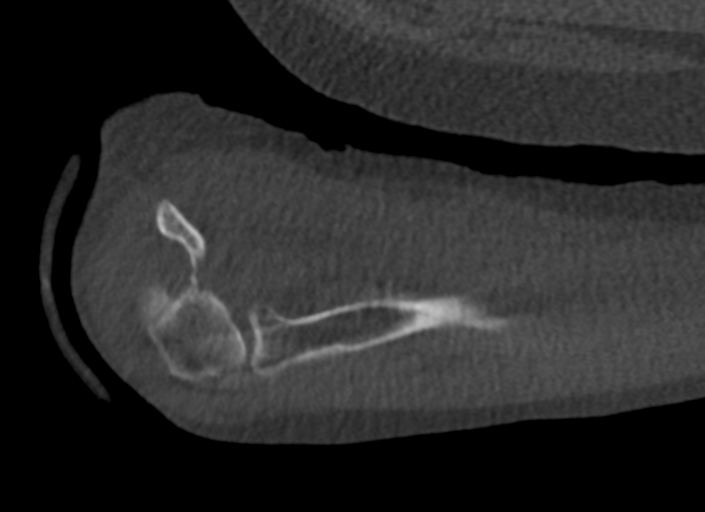

[13 of 33 positions shown; findings below may reference images not displayed]

FINDINGS: There is a displaced intra-articular fracture through the base of
the olecranon with significant displacement due to the pull of the
triceps tendon. Maximum displacement is approximately 19 mm. The
separation at the articular surface is 9.5 mm.

The humerus is intact. No fracture. The radius is intact. The
radiocapitellar joint space is maintained.

Associated expected joint effusion.
IMPRESSION: Displaced intra-articular fracture of the olecranon.

No other fractures are identified.

## 2022-12-10 IMAGING — XA DG ELBOW COMPLETE 3+V*R*
1 series · 3 of 3 positions shown · non-contrast
Comparison: Right elbow x-ray 09/09/2021.

CLINICAL DATA: ORIF right olecranon fracture.

EXAM:
RIGHT ELBOW - COMPLETE 3+ VIEW

[Series 1: unknown protocol · 0.30mm/px · 3 of 3 slices shown]
[im 1/3]
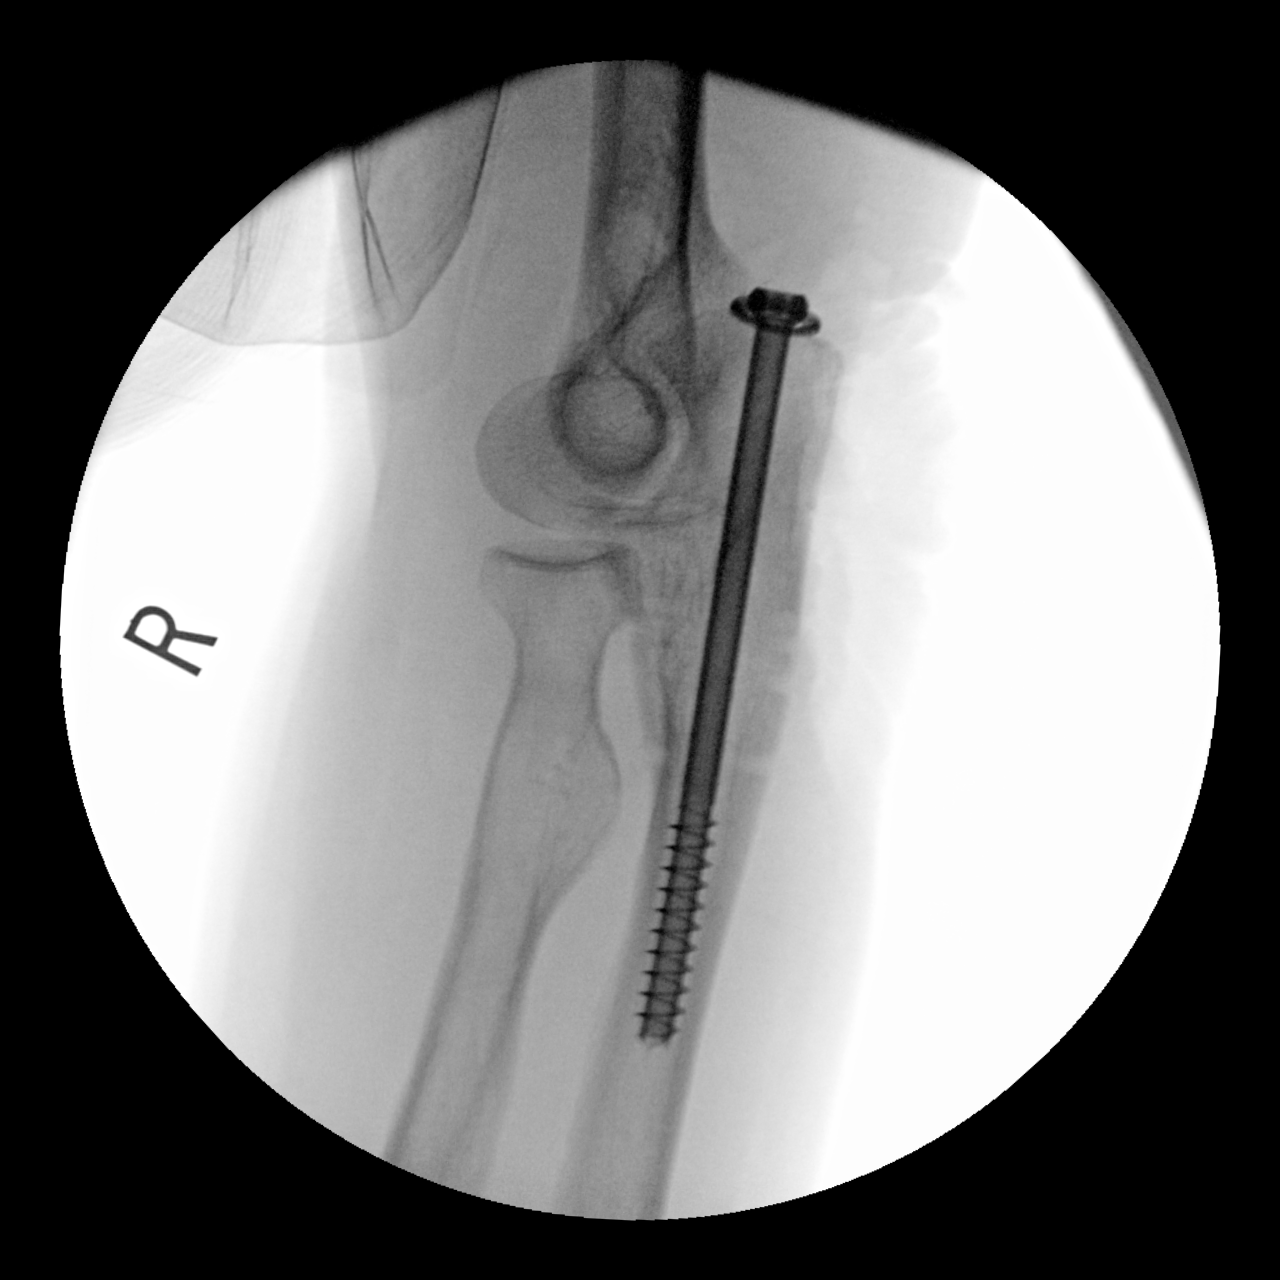
[im 2/3]
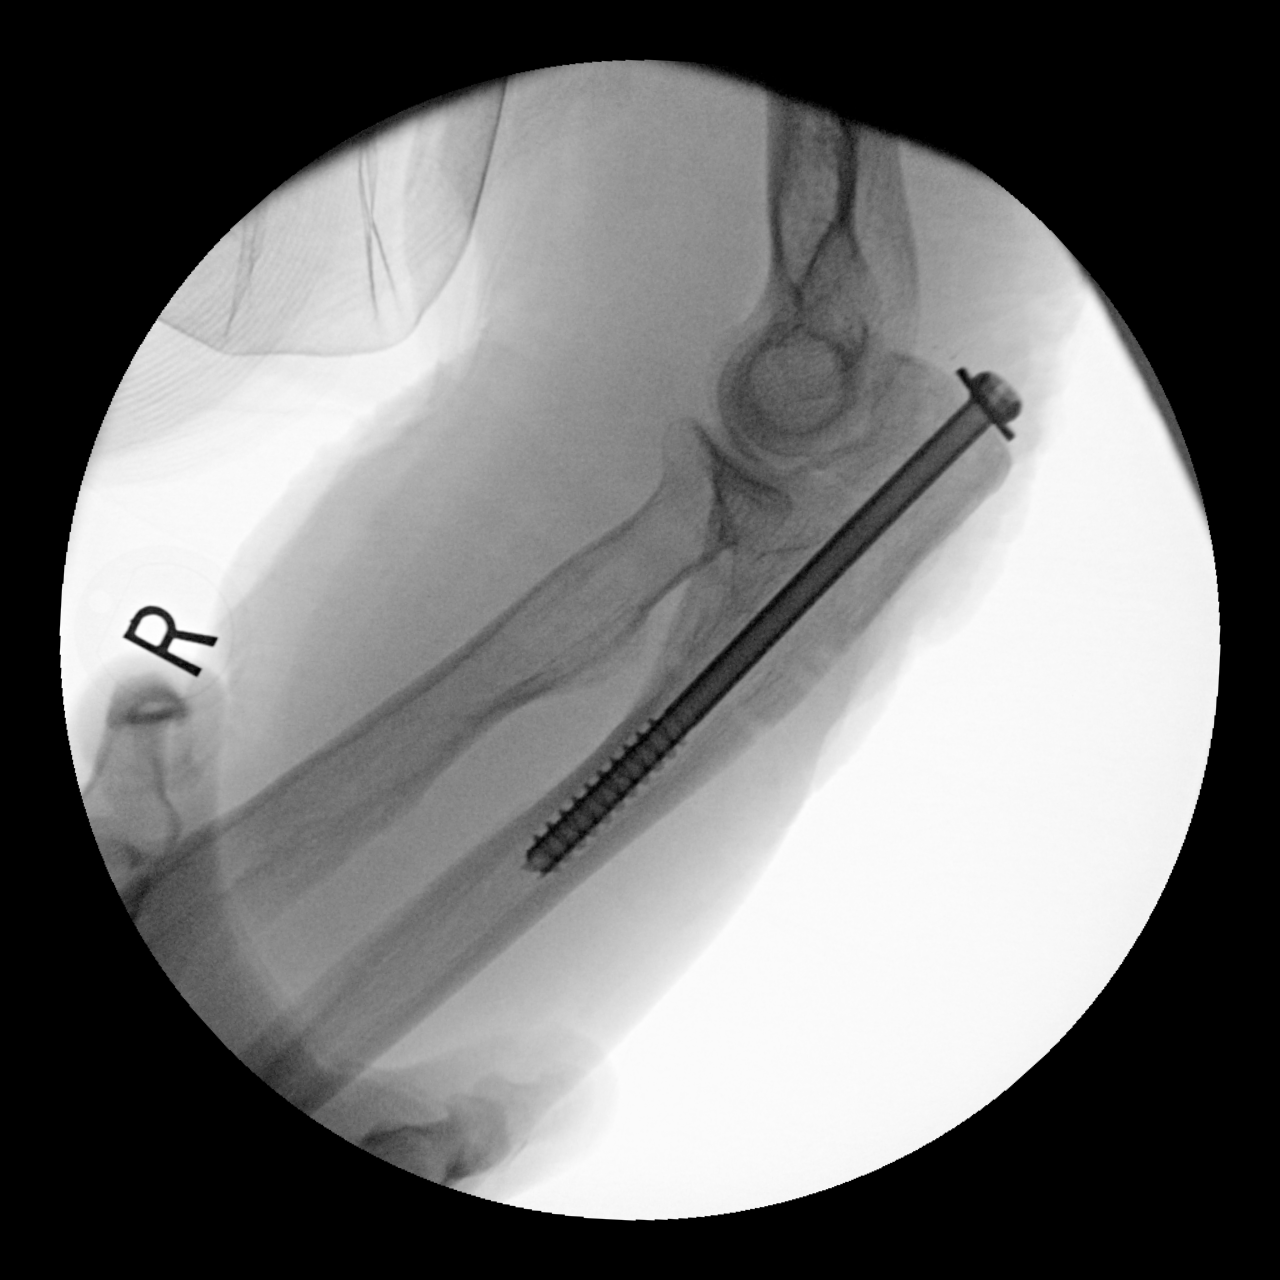
[im 3/3]
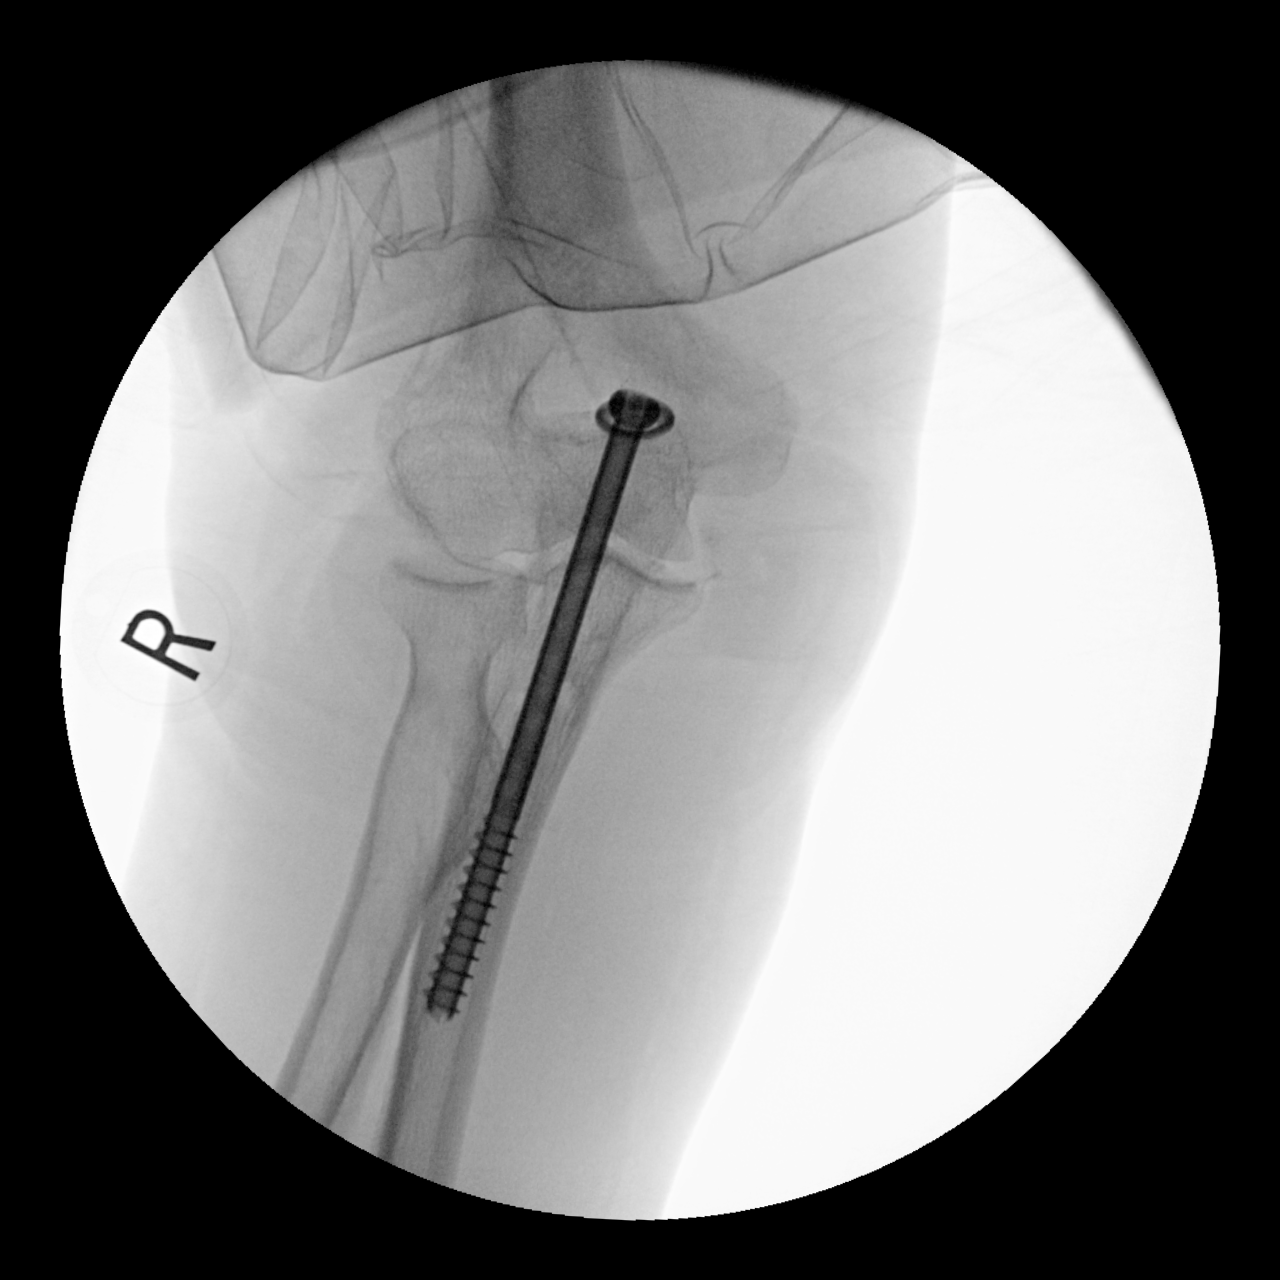

[3 of 3 positions shown; findings below may reference images not displayed]

FINDINGS: Intraoperative right elbow.

Three low resolution intraoperative spot views of the right elbow
were obtained. Orthopedic screws seen fixating olecranon fracture.
Alignment is anatomic.

Total fluoroscopy time: 4.3 seconds

Total radiation dose: 8.95 micro Gy
IMPRESSION: Intraoperative ORIF olecranon fracture.

## 2022-12-16 ENCOUNTER — Ambulatory Visit: Payer: Medicare PPO | Admitting: Neurology

## 2023-01-15 ENCOUNTER — Ambulatory Visit: Payer: Medicare PPO | Admitting: Neurology

## 2023-01-29 ENCOUNTER — Other Ambulatory Visit: Payer: Self-pay | Admitting: Physician Assistant

## 2023-01-29 DIAGNOSIS — Z0000 Encounter for general adult medical examination without abnormal findings: Secondary | ICD-10-CM

## 2023-02-05 ENCOUNTER — Ambulatory Visit: Payer: Medicare PPO | Admitting: Neurology

## 2023-02-05 VITALS — BP 142/77 | HR 75 | Ht 61.0 in

## 2023-02-05 DIAGNOSIS — G3184 Mild cognitive impairment, so stated: Secondary | ICD-10-CM

## 2023-02-05 DIAGNOSIS — R4589 Other symptoms and signs involving emotional state: Secondary | ICD-10-CM

## 2023-02-05 DIAGNOSIS — Z9181 History of falling: Secondary | ICD-10-CM

## 2023-02-05 DIAGNOSIS — Z9189 Other specified personal risk factors, not elsewhere classified: Secondary | ICD-10-CM

## 2023-02-05 DIAGNOSIS — Z9884 Bariatric surgery status: Secondary | ICD-10-CM | POA: Diagnosis not present

## 2023-02-05 MED ORDER — TRAZODONE HCL 50 MG PO TABS: 50.0000 mg | ORAL_TABLET | Freq: Every day | ORAL | 1 refills | Status: DC

## 2023-02-05 NOTE — Patient Instructions (Signed)
There are well-accepted and sensible ways to reduce risk for Alzheimers disease and other degenerative brain disorders .  Exercise Daily Walk A daily 20 minute walk should be part of your routine. Disease related apathy can be a significant roadblock to exercise and the only way to overcome this is to make it a daily routine and perhaps have a reward at the end (something your loved one loves to eat or drink perhaps) or a personal trainer coming to the home can also be very useful. Most importantly, the patient is much more likely to exercise if the caregiver / spouse does it with him/her. In general a structured, repetitive schedule is best.  General Health: Any diseases which effect your body will effect your brain such as a pneumonia, urinary infection, blood clot, heart attack or stroke. Keep contact with your primary care doctor for regular follow ups.  Sleep. A good nights sleep is healthy for the brain. Seven hours is recommended. If you have insomnia or poor sleep habits we can give you some instructions. If you have sleep apnea wear your mask.  Diet: Eating a heart healthy diet is also a good idea; fish and poultry instead of red meat, nuts (mostly non-peanuts), vegetables, fruits, olive oil or canola oil (instead of butter), minimal salt (use other spices to flavor foods), whole grain rice, bread, cereal and pasta and wine in moderation.Research is now showing that the MIND diet, which is a combination of The Mediterranean diet and the DASH diet, is beneficial for cognitive processing and longevity. Information about this diet can be found in The MIND Diet, a book by Alonna Minium, MS, RDN, and online at [email or web address]  Finances, Power of [address] and Advance Directives: You should consider putting legal safeguards in place with regard to financial and medical decision making. While the spouse always has power of attorney for medical and financial issues in the  absence of any form, you should consider what you want in case the spouse / caregiver is no longer around or capable of making decisions.   The Alzheimers Association Position on Disease Prevention  Can Alzheimer's be prevented? It's a question that continues to intrigue researchers and fuel new investigations. There are no clear-cut answers yet -- partially due to the need for more large-scale studies in diverse populations -- but promising research is under way. The Alzheimer's Association is leading the worldwide effort to find a treatment for Alzheimer's, delay its onset and prevent it from developing.   What causes Alzheimer's? Experts agree that in the vast majority of cases, Alzheimer's, like other common chronic conditions, probably develops as a result of complex interactions among multiple factors, including age, genetics, environment, lifestyle and coexisting medical conditions. Although some risk factors -- such as age or genes -- cannot be changed, other risk factors -- such as high blood pressure and lack of exercise -- usually can be changed to help reduce risk. Research in these areas may lead to new ways to detect those at highest risk.  Prevention studies A small percentage of people with Alzheimer's disease (less than 1 percent) have an early-onset type associated with genetic mutations. Individuals who have these genetic mutations are guaranteed to develop the disease. An ongoing clinical trial conducted by the [facility] ([facility]), is testing whether antibodies to beta-amyloid can reduce the accumulation of beta-amyloid plaque in the brains of people with such genetic mutations and thereby reduce, delay or prevent symptoms. Participants in the trial are receiving antibodies (  or placebo) before they develop symptoms, and the development of beta-amyloid plaques is being monitored by brain scans and other tests.  Another clinical trial, known as the A4 trial  (Anti-Amyloid Treatment in Asymptomatic Alzheimer's), is testing whether antibodies to beta-amyloid can reduce the risk of Alzheimer's disease in older people (ages 3 to 86) at high risk for the disease. The A4 trial is being conducted by the Alzheimer's Disease Cooperative [facility].  Though research is still evolving, evidence is strong that people can reduce their risk by making key lifestyle changes, including participating in regular activity and maintaining good heart health. Based on this research, the Alzheimer's Association offers 10 Ways to Love Your Brain -- a collection of tips that can reduce the risk of cognitive decline.  Heart-head connection  New research shows there are things we can do to reduce the risk of mild cognitive impairment and dementia.  Several conditions known to increase the risk of cardiovascular disease -- such as high blood pressure, diabetes and high cholesterol -- also increase the risk of developing Alzheimer's. Some autopsy studies show that as many as 80 percent of individuals with Alzheimer's disease also have cardiovascular disease.  A longstanding question is why some people develop hallmark Alzheimer's plaques and tangles but do not develop the symptoms of Alzheimer's. Vascular disease may help researchers eventually find an answer. Some autopsy studies suggest that plaques and tangles may be present in the brain without causing symptoms of cognitive decline unless the brain also shows evidence of vascular disease. More research is needed to better understand the link between vascular health and Alzheimer's.  Physical exercise and diet Regular physical exercise may be a beneficial strategy to lower the risk of Alzheimer's and vascular dementia. Exercise may directly benefit brain cells by increasing blood and oxygen flow in the brain. Because of its known cardiovascular benefits, a medically approved exercise program is a valuable part of any overall wellness  plan.  Current evidence suggests that heart-healthy eating may also help protect the brain. Heart-healthy eating includes limiting the intake of sugar and saturated fats and making sure to eat plenty of fruits, vegetables, and whole grains. No one diet is best. Two diets that have been studied and may be beneficial are the DASH (Dietary Approaches to Stop Hypertension) diet and the Mediterranean diet. The DASH diet emphasizes vegetables, fruits and fat-free or low-fat dairy products; includes whole grains, fish, poultry, beans, seeds, nuts and vegetable oils; and limits sodium, sweets, sugary beverages and red meats. A Mediterranean diet includes relatively little red meat and emphasizes whole grains, fruits and vegetables, fish and shellfish, and nuts, olive oil and other healthy fats.  Social connections and intellectual activity A number of studies indicate that maintaining strong social connections and keeping mentally active as we age might lower the risk of cognitive decline and Alzheimer's. Experts are not certain about the reason for this association. It may be due to direct mechanisms through which social and mental stimulation strengthen connections between nerve cells in the brain.  Head trauma There appears to be a strong link between future risk of Alzheimer's and serious head trauma, especially when injury involves loss of consciousness. You can help reduce your risk of Alzheimer's by protecting your head.  Wear a seat belt  Use a helmet when participating in sports  "Fall-proof" your home   What you can do now While research is not yet conclusive, certain lifestyle choices, such as physical activity and diet, may help support brain  health and prevent Alzheimer's. Many of these lifestyle changes have been shown to lower the risk of other diseases, like heart disease and diabetes, which have been linked to Alzheimer's. With few drawbacks and plenty of known benefits, healthy lifestyle  choices can improve your health and possibly protect your brain.  Learn more about brain health. You can help increase our knowledge by considering participation in a clinical study. Our free clinical trial matching services, [employer], can help you find clinical trials in your area that are seeking volunteers.  Understanding prevention research Here are some things to keep in mind about the research underlying much of our current knowledge about possible prevention:  Insights about potentially modifiable risk factors apply to large population groups, not to individuals. Studies can show that factor X is associated with outcome Y, but cannot guarantee that any specific person will have that outcome. As a result, you can "do everything right" and still have a serious health problem or "do everything wrong" and live to be 100.  Much of our current evidence comes from large epidemiological studies such as the [place] Aging Study, the Nurses' Health Study, the Adult Changes in Thought Study and the [employer]. These studies explore pre-existing behaviors and use statistical methods to relate those behaviors to health outcomes. This type of study can show an "association" between a factor and an outcome but cannot "prove" cause and effect. This is why we describe evidence based on these studies with such language as "suggests," "may show," "might protect," and "is associated with."  The gold standard for showing cause and effect is a clinical trial in which participants are randomly assigned to a prevention or risk management strategy or a control group. Researchers follow the two groups over time to see if their outcomes differ significantly.  It is unlikely that some prevention or risk management strategies will ever be tested in randomized trials for ethical or practical reasons. One example is exercise. Definitively testing the impact of exercise on Alzheimer's risk would require a huge  trial enrolling thousands of people and following them for many years. The expense and logistics of such a trial would be prohibitive, and it would require some people to go without exercise, a known health benefit.

## 2023-02-05 NOTE — Progress Notes (Addendum)
Provider:  Melvyn Novas, MD  Primary Care Physician:  Dani Gobble, PA-C [address] [place] [address]     Referring Provider: Dani Gobble, Pa-c [address] [place],  [place] [address]          Chief Complaint according to patient   Patient presents with:                HISTORY OF PRESENT ILLNESS:  [name] [name] is a 72 y.o. female patient who is here for revisit 02/05/2023 for  Gait disorder, falls and memory concerns. History of Roux-en-Y gastric bypass in 2014. TIA history , started  her memory concerns,  She seems to have very low confidence at this time, clingy -  Chief concern according to patient :  no interval changes since February. Her daughter is here with her and seems to disagree- She is asking questions again, repeatedly. She wont forget activities after she participated but forgets appointments and what they are for.  She misplaced a jewelery bag while travelling and got upset. She stashes papers,is not aware later where she put what.  She drives and sometimes takes the wrong exit- then and is panicked , in tears, but will go to a gas station and asks her way out.     The patient today endorsed the geriatric depression score at 3 out of 15 points subtotal for depression, fatigue severity score 27 out of 63 points, Epworth Sleepiness Scale at 5 out of 24 points.  Our last visit on 06-23-2022 had evaluated her memory by a Montreal cognitive assessment in which she scored 28 points( the patient is a retired Runner, broadcasting/film/video,) another concern was the frequency of falls balance problems and gait disorder.   There has been no interval fall and her family states that she has been " always walking funny ". She underwent imaging studies. 08-29-2022: Dr Pearlean Brownie: MRI scan of the brain with and without contrast showing small remote age left midbrain and right ventral thalamic lacunar infarcts and age-appropriate changes of  chronic small vessel disease and mild supratentorial cortical atrophy. No acute abnormalities.   IMPRESSION: MRI cervical spine with and without contrast showing mild disc degenerative changes throughout most prominent at C5/6 where there is mild bilateral foraminal narrowing but no definite compression.     Review of Systems: Out of a complete 14 system review, the patient complains of only the following symptoms, and all other reviewed systems are negative.:    Today she was supposed to be at [employer] for lunch,and instead went to the [place] and had to ask her daughter by phone how to went from the one [place] to the next.    Fatigue, sleepiness , snoring, fragmented sleep, Insomnia, RLS, Nocturia   How likely are you to doze in the following situations: 0 = not likely, 1 = slight chance, 2 = moderate chance, 3 = high chance   Sitting and Reading? Watching Television? Sitting inactive in a public place (theater or meeting)? As a passenger in a car for an hour without a break? Lying down in the afternoon when circumstances permit? Sitting and talking to someone? Sitting quietly after lunch without alcohol? In a car, while stopped for a few minutes in traffic?   The patient today endorsed the geriatric depression score at 3 out of 15 points subtotal for depression, Fatigue severity score 27 out of 63 points,  Epworth  Sleepiness Scale at 5 out of 24 points Social History   Socioeconomic History   Marital status: Married    Spouse name: Not on file   Number of children: 2   Years of education: Not on file   Highest education level: Not on file  Occupational History   Occupation: retired Runner, broadcasting/film/video  Tobacco Use   Smoking status: Never   Smokeless tobacco: Never  Vaping Use   Vaping status: Never Used  Substance and Sexual Activity   Alcohol use: Yes    Alcohol/week: 1.0 - 2.0 standard drink of alcohol    Types: 1 - 2 Glasses of wine per week    Comment: 1-2 glass winn daily     Drug use: No   Sexual activity: Not on file  Other Topics Concern   Not on file  Social History Narrative   Not on file   Social Determinants of Health   Financial Resource Strain: Low Risk  (10/07/2022)   Received from [facility], [facility]   Overall Financial Resource Strain (CARDIA)    Difficulty of Paying Living Expenses: Not hard at all  Food Insecurity: No Food Insecurity (10/07/2022)   Received from [facility]   Hunger Vital Sign    Worried About Running Out of Food in the Last Year: Never true    Ran Out of Food in the Last Year: Never true  Transportation Needs: No Transportation Needs (10/07/2022)   Received from [facility], [facility]   PRAPARE - Transportation    Lack of Transportation (Medical): No    Lack of Transportation (Non-Medical): No  Physical Activity: Insufficiently Active (10/07/2022)   Received from [facility], [facility]   Exercise Vital Sign    Days of Exercise per Week: 3 days    Minutes of Exercise per Session: 40 min  Stress: Stress Concern Present (10/07/2022)   Received from [facility], [facility] of Occupational Health - Occupational Stress Questionnaire    Feeling of Stress : Rather much  Social Connections: Socially Integrated (10/07/2022)   Received from [facility], [facility]   Social Network    How would you rate your social network (family, work, friends)?: Good participation with social networks    Family History  Problem Relation Age of Onset   Lung cancer Mother    Cancer Mother        lung   Heart disease Father    HIV Brother    Cancer Paternal Aunt 38       pancreatic cancer    Breast cancer Paternal Aunt    Cancer Maternal Grandmother        liver cancer    Cancer Paternal Aunt 19       breast cancer   Heart attack Maternal Grandfather    Congestive Heart Failure Paternal Grandmother    Stroke Paternal Grandfather    Melanoma Son 37    Past Medical  History:  Diagnosis Date   Arthritis    feet,knees,hands   Cancer (HCC)    melanoma   Chronic anxiety    Family history of breast cancer    Family history of melanoma    Family history of pancreatic cancer    Hyperlipidemia    Hypertension    Obesity    Personal history of malignant melanoma     Past Surgical History:  Procedure Laterality Date   ABDOMINAL HYSTERECTOMY     APPENDECTOMY  1978  BREAST EXCISIONAL BIOPSY Right    BREAST LUMPECTOMY WITH RADIOACTIVE SEED LOCALIZATION Right 07/10/2016   Procedure: RIGHT BREAST LUMPECTOMY WITH RADIOACTIVE SEED LOCALIZATION;  Surgeon: Chevis Pretty III, MD;  Location: [facility];  Service: General;  Laterality: Right;   GASTRIC ROUX-EN-Y  03/09/2012   Procedure: LAPAROSCOPIC ROUX-EN-Y GASTRIC;  Surgeon: [name], MD,FACS;  Location: [facility];  Service: General;  Laterality: N/A;   low back  2010   x3   ORIF ELBOW FRACTURE Right 09/20/2021   Procedure: OPEN REDUCTION INTERNAL FIXATION (ORIF) RIGHT OLECRANON FRACTURE;  Surgeon: Ernestina Columbia, MD;  Location: [facility];  Service: Orthopedics;  Laterality: Right;   right ureter surgery     in college   UPPER GI ENDOSCOPY  03/09/2012   Procedure: UPPER GI ENDOSCOPY;  Surgeon: [name], MD,FACS;  Location: [facility];  Service: General;  Laterality: N/A;     Current Outpatient Medications on File Prior to Visit  Medication Sig Dispense Refill   Ascorbic Acid (VITAMIN C PO) Take 500 mg by mouth 2 (two) times daily.     ASPIRIN 81 PO Take by mouth daily.     atorvastatin (LIPITOR) 10 MG tablet Take 10 mg by mouth daily.     budesonide (ENTOCORT EC) 3 MG 24 hr capsule Take 9 mg by mouth daily.     buPROPion (WELLBUTRIN XL) 150 MG 24 hr tablet Take 150 mg by mouth daily.     busPIRone (BUSPAR) 7.5 MG tablet Take 7.5 mg by mouth 3 (three) times daily as needed (anxiety).     calcium-vitamin D (OSCAL WITH D) 500-200 MG-UNIT tablet Take 1 tablet by mouth daily.      diclofenac (VOLTAREN) 75 MG EC tablet Take 75 mg by mouth as directed.     ferrous sulfate 325 (65 FE) MG tablet Take 325 mg by mouth in the morning and at bedtime.     FLUoxetine (PROZAC) 40 MG capsule Take 20 mg by mouth daily.      hydrochlorothiazide (MICROZIDE) 12.5 MG capsule Take 12.5 mg by mouth daily.     losartan (COZAAR) 50 MG tablet Take 50 mg by mouth daily.     zolpidem (AMBIEN) 10 MG tablet Take 10 mg by mouth at bedtime as needed.     No current facility-administered medications on file prior to visit.    Allergies  Allergen Reactions   Codeine Nausea Only     DIAGNOSTIC DATA (LABS, IMAGING, TESTING) - I reviewed patient records, labs, notes, testing and imaging myself where available.  Lab Results  Component Value Date   WBC 4.5 09/09/2021   HGB 12.1 09/09/2021   HCT 37.4 09/09/2021   MCV 94.2 09/09/2021   PLT 304 09/09/2021      Component Value Date/Time   NA 134 (L) 09/09/2021 1643   K 3.3 (L) 09/09/2021 1643   CL 100 09/09/2021 1643   CO2 24 09/09/2021 1643   GLUCOSE 96 09/09/2021 1643   BUN 18 09/09/2021 1643   CREATININE 0.68 09/09/2021 1643   CREATININE 0.88 07/29/2012 1121   CALCIUM 8.7 (L) 09/09/2021 1643   PROT 6.6 07/29/2012 1121   ALBUMIN 4.0 07/29/2012 1121   AST 20 07/29/2012 1121   ALT 27 07/29/2012 1121   ALKPHOS 73 07/29/2012 1121   BILITOT 0.4 07/29/2012 1121   GFRNONAA >60 09/09/2021 1643   GFRAA >60 07/07/2016 1217   Lab Results  Component Value Date   CHOL 242 (H) 07/29/2012  HDL 54 07/29/2012   LDLCALC 171 (H) 07/29/2012   TRIG 87 07/29/2012   CHOLHDL 4.5 07/29/2012   No results found for: "HGBA1C" Lab Results  Component Value Date   VITAMINB12 337 06/23/2022   Lab Results  Component Value Date   TSH 2.297 01/21/2012    PHYSICAL EXAM:  Today's Vitals   02/05/23 1421  BP: (!) 142/77  Pulse: 75  Height: 5\' 1"  (1.549 m)   Body mass index is 27.78 kg/m.   Wt Readings from Last 3 Encounters:  04/11/22 147 lb  (66.7 kg)  02/03/22 168 lb (76.2 kg)  09/20/21 168 lb 10.4 oz (76.5 kg)     Ht Readings from Last 3 Encounters:  02/05/23 5\' 1"  (1.549 m)  06/23/22 5\' 1"  (1.549 m)  04/11/22 5\' 1"  (1.549 m)      General: The patient is awake, alert and appears not in acute distress. The patient is well groomed. Head: Normocephalic, atraumatic. Neck is supple.  Mallampati 1,  neck circumference:13.5  inches . Nasal airflow  patent.  Retrognathia is not seen.  Dental status: biological  Cardiovascular:  Regular rate and cardiac rhythm by pulse,  without distended neck veins. Respiratory: Lungs are clear to auscultation.  Skin:  Without evidence of ankle edema, or rash. Trunk: The patient's posture is erect.   NEUROLOGIC EXAM: The patient is awake and alert, oriented to place and time.   Memory subjective described as impaired -     02/05/2023    2:23 PM 06/23/2022    2:13 PM  Montreal Cognitive Assessment   Visuospatial/ Executive (0/5) 5 5  Naming (0/3) 3 3  Attention: Read list of digits (0/2) 2 2  Attention: Read list of letters (0/1) 0 1  Attention: Serial 7 subtraction starting at 100 (0/3) 3 3  Language: Repeat phrase (0/2) 2 2  Language : Fluency (0/1) 1 1  Abstraction (0/2) 2 2  Delayed Recall (0/5) 1 3  Orientation (0/6) 6 6  Total 25 28    Attention span & concentration ability appears normal.  Speech is fluent,  without  dysarthria, dysphonia or aphasia.  Mood and affect are concerned, a bit jumpy or anxious.    Cranial nerves: no loss of smell or taste reported  Pupils are equal and briskly reactive to light. Funduscopic exam deferred.  Extraocular movements in vertical and horizontal planes were intact and without nystagmus. No Diplopia. Visual fields by finger perimetry are intact. Hearing was intact to soft voice and tuning fork.   Facial sensation intact to fine touch.  Facial motor strength is symmetric and tongue and uvula move midline.  Tongue trembling.  Neck ROM  : rotation, tilt and flexion extension were normal for age and shoulder shrug was symmetrical.    Motor exam:  Symmetric bulk, tone and ROM.   Normal tone without cog-wheeling, symmetrically attenuated  grip strength-  .   Sensory:  Fine touch and vibration were felt at the ankle but sensation was decreased . Proprioception tested in the upper extremities was normal.   Coordination: Rapid alternating movements in the fingers/hands were of normal speed.  The Finger-to-nose maneuver was intact without evidence of ataxia, dysmetria or tremor.   Gait and station: Patient could rise unassisted from a seated position, walked without assistive device.  Stance is of normal width/ base and the patient turned with 3.5 steps.  Toe and heel walk were deferred.  Tandem gait was impaired, severely impaired  - ataxia.  Deep  tendon reflexes: in the upper and lower extremities are symmetric and intact.  Babinski response was deferred.   ASSESSMENT AND PLAN 34- year- old female  here with:    1) Memory concerns, MCI: short term memory most affected.MOCA 24/ 30 today but excellent trail making test result and fluent conversation.   2) high level anxiety- will suggest to PCP to increase SSRI. Continue your healthy diet and exercise. Do stop taking nyquil  or benadryl as sleep aid, stop taken hour long naps and you may not need a sleep aid, spend no more than 9 hors in bed overnight, and keep naps shorter than 1 hour. like to offer alternatives :trazodoneand magnesium . She is on prozac.  Plan:  I will add ATN, neuro filament , GNA dementia panel. Consider amyloid PET scan if positive.  Mind Diet, Baby ASA daily.    I plan to follow up either personally or through our NP within 6 months. MOCA again, Neuropsychological testing ordered.   I would like to thank Dani Gobble, PA-C or allowing me to meet with and to take care of this pleasant patient.   CC: I will share my notes with PCP .RV in 6  months.   After spending a total time of  37  minutes face to face and additional time for physical and neurologic examination, review of laboratory studies,  personal review of imaging studies, reports and results of other testing and review of referral information / records as far as provided in visit,   Electronically signed by: Melvyn Novas, MD 02/05/2023 2:42 PM  [facility] Board certified by The [employer] of Sleep Medicine and Diplomate of the [employer] of Sleep Medicine. Board certified In Neurology through the ABPN, Fellow of the [employer] of Neurology.

## 2023-02-09 ENCOUNTER — Telehealth: Payer: Self-pay | Admitting: Neurology

## 2023-02-09 NOTE — Telephone Encounter (Signed)
Pt's daughter called stating that the pt informed her that the medication that was given to her in her last appt is making her have a "swimming feeling in her brain" When she gets up in the middle of the night she has to sit for a while so that the room can stop spinning. Please advise.

## 2023-02-09 NOTE — Telephone Encounter (Signed)
Called the patients daughter back. She has been taking the 50 mg at bedtime. She gets up in the middle of the night and after going to the bathroom she is able to fall back asleep. Advised the patient to cut back to 25 mg and see if that gets better.

## 2023-02-11 LAB — COMPREHENSIVE METABOLIC PANEL
ALT: 23 [IU]/L (ref 0–32)
AST: 24 [IU]/L (ref 0–40)
Albumin: 4.4 g/dL (ref 3.8–4.8)
Alkaline Phosphatase: 66 [IU]/L (ref 44–121)
BUN/Creatinine Ratio: 19 (ref 12–28)
BUN: 13 mg/dL (ref 8–27)
Bilirubin Total: 0.2 mg/dL (ref 0.0–1.2)
CO2: 22 mmol/L (ref 20–29)
Calcium: 9.6 mg/dL (ref 8.7–10.3)
Chloride: 100 mmol/L (ref 96–106)
Creatinine, Ser: 0.7 mg/dL (ref 0.57–1.00)
Globulin, Total: 2 g/dL (ref 1.5–4.5)
Glucose: 88 mg/dL (ref 70–99)
Potassium: 3.4 mmol/L — ABNORMAL LOW (ref 3.5–5.2)
Sodium: 138 mmol/L (ref 134–144)
Total Protein: 6.4 g/dL (ref 6.0–8.5)
eGFR: 92 mL/min/{1.73_m2} (ref >59)

## 2023-02-11 LAB — CBC WITH DIFFERENTIAL/PLATELET
Basophils Absolute: 0 10*3/uL (ref 0.0–0.2)
Basos: 1 %
EOS (ABSOLUTE): 1.1 10*3/uL — ABNORMAL HIGH (ref 0.0–0.4)
Eos: 13 %
Hematocrit: 40.3 % (ref 34.0–46.6)
Hemoglobin: 13.2 g/dL (ref 11.1–15.9)
Immature Grans (Abs): 0 10*3/uL (ref 0.0–0.1)
Immature Granulocytes: 0 %
Lymphocytes Absolute: 1.6 10*3/uL (ref 0.7–3.1)
Lymphs: 19 %
MCH: 31.9 pg (ref 26.6–33.0)
MCHC: 32.8 g/dL (ref 31.5–35.7)
MCV: 97 fL (ref 79–97)
Monocytes Absolute: 0.5 10*3/uL (ref 0.1–0.9)
Monocytes: 6 %
Neutrophils Absolute: 5.2 10*3/uL (ref 1.4–7.0)
Neutrophils: 61 %
Platelets: 375 10*3/uL (ref 150–450)
RBC: 4.14 x10E6/uL (ref 3.77–5.28)
RDW: 12.4 % (ref 11.7–15.4)
WBC: 8.4 10*3/uL (ref 3.4–10.8)

## 2023-02-11 LAB — SEDIMENTATION RATE: Sed Rate: 2 mm/h (ref 0–40)

## 2023-02-11 LAB — ATN PROFILE
A -- Beta-amyloid 42/40 Ratio: 0.109 (ref >0.102)
Beta-amyloid 40: 167.22 pg/mL
Beta-amyloid 42: 18.15 pg/mL
N -- NfL, Plasma: 2.82 pg/mL (ref 0.00–7.64)
T -- p-tau181: 0.75 pg/mL (ref 0.00–0.97)

## 2023-02-11 LAB — PROTEIN ELECTROPHORESIS, SERUM
A/G Ratio: 2 — ABNORMAL HIGH (ref 0.7–1.7)
Albumin ELP: 4.3 g/dL (ref 2.9–4.4)
Alpha 1: 0.2 g/dL (ref 0.0–0.4)
Alpha 2: 0.5 g/dL (ref 0.4–1.0)
Beta: 0.7 g/dL (ref 0.7–1.3)
Gamma Globulin: 0.7 g/dL (ref 0.4–1.8)
Globulin, Total: 2.1 g/dL — ABNORMAL LOW (ref 2.2–3.9)

## 2023-02-11 LAB — HIV ANTIBODY (ROUTINE TESTING W REFLEX): HIV Screen 4th Generation wRfx: NONREACTIVE

## 2023-02-11 LAB — HEMOGLOBIN A1C
Est. average glucose Bld gHb Est-mCnc: 94 mg/dL
Hgb A1c MFr Bld: 4.9 % (ref 4.8–5.6)

## 2023-02-11 LAB — ENA+DNA/DS+SJORGEN'S
ENA RNP Ab: <0.2 AI (ref 0.0–0.9)
ENA SM Ab Ser-aCnc: <0.2 AI (ref 0.0–0.9)
ENA SSA (RO) Ab: 0.2 AI (ref 0.0–0.9)
ENA SSB (LA) Ab: 1.3 AI — ABNORMAL HIGH (ref 0.0–0.9)
dsDNA Ab: <1 [IU]/mL (ref 0–9)

## 2023-02-11 LAB — NEUROFILAMENT LIGHT CHAIN: Neurofilament Light Chain: 2.31 pg/mL (ref 0.00–7.64)

## 2023-02-11 LAB — HOMOCYSTEINE: Homocysteine: 20.9 umol/L — ABNORMAL HIGH (ref 0.0–19.2)

## 2023-02-11 LAB — ANA W/REFLEX: Anti Nuclear Antibody (ANA): POSITIVE — AB

## 2023-02-11 LAB — RPR: RPR Ser Ql: NONREACTIVE

## 2023-02-12 ENCOUNTER — Telehealth: Payer: Self-pay

## 2023-02-12 ENCOUNTER — Telehealth: Payer: Self-pay | Admitting: Neurology

## 2023-02-12 DIAGNOSIS — R768 Other specified abnormal immunological findings in serum: Secondary | ICD-10-CM

## 2023-02-12 NOTE — Telephone Encounter (Signed)
[facility] called to request pt's lab to be fax over. Faxed labs in 2 part due to volume to [phone]

## 2023-02-12 NOTE — Telephone Encounter (Signed)
I spoke with the patient and provided results of the blood test. She verbalized understanding of the findings and expressed appreciation for the call.  The patient stated an imaging report from several years ago will be sent by [facility] office to [facility]. She would like Dr. Vickey Huger to review this.  A referral has been placed to rheumatology.

## 2023-02-12 NOTE — Telephone Encounter (Signed)
-----   Message from [place] Dohmeier sent at 02/11/2023  5:58 PM EDT ----- Positive ANA test, and ENA SSB LA ab -  Abnormal result for eosinophilia ,  And homocysteine elevation ( mild ) .  Sed rate was normal.   This speaks for an autoimmune disease to be present , and needs to be investigated with  a rheumatologist. Rheumatoid arthritis, Sjgren, SLE, mixed connective tissue disease can be associated disorders.   Your Alzheimer's disease bio markers were NEGATIVE ! This is a very good result.

## 2023-02-12 NOTE — Telephone Encounter (Signed)
Referral for rheumatology fax to [facility]. Phone: [phone], Fax: [phone]

## 2023-02-13 NOTE — Telephone Encounter (Addendum)
Reviewed report, will scan to media.  No need to repeat MRI - last MRI brain was performed in April for ataxia. No melanoma was seen- very important.   She will need to see [facility]

## 2023-02-17 ENCOUNTER — Ambulatory Visit
Admission: RE | Admit: 2023-02-17 | Discharge: 2023-02-17 | Disposition: A | Discharge status: Home / Self Care | Payer: Medicare PPO | Source: Ambulatory Visit | Attending: Physician Assistant | Admitting: Physician Assistant

## 2023-02-17 DIAGNOSIS — Z0000 Encounter for general adult medical examination without abnormal findings: Secondary | ICD-10-CM

## 2023-03-19 ENCOUNTER — Institutional Professional Consult (permissible substitution): Payer: Medicare PPO | Admitting: Adult Health

## 2023-07-30 ENCOUNTER — Ambulatory Visit: Payer: Medicare PPO | Admitting: Adult Health

## 2023-09-09 NOTE — Progress Notes (Signed)
 Cardiology Office Note   Date:  09/14/2023   ID:  [name]  ASANTE [name], DOB [birth year], MRN [id number]  PCP:  Jearlean Mince, PA-C  Cardiologist:   Kewanna Kasprzak [place], MD   Chief Complaint  Patient presents with   Hypertension       History of Present Illness:  [name] [name] is a 73 y.o. female who is seen for follow up HTN.  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 She has a history of HLD.. I had cared for her father [name] [name] for a number of years. She was evaluated in November of 2011 with a stress Cardiolite study which was normal. She also had an echocardiogram which demonstrated mild LVH and mild biatrial enlargement but was otherwise normal. She lives much of the year in the [place].   She notes she has lost an additional 24 lbs this year. BP at home has been well controlled.  Cholesterol is much better.   She is very active walking, riding a recumbent bike or swimming. Denies any chest pain or dyspnea.    Past Medical History:  Diagnosis Date   Arthritis    feet,knees,hands   Cancer (HCC)    melanoma   Chronic anxiety    Family history of breast cancer    Family history of melanoma    Family history of pancreatic cancer    Hyperlipidemia    Hypertension    Obesity    Personal history of malignant melanoma     Past Surgical History:  Procedure Laterality Date   ABDOMINAL HYSTERECTOMY     APPENDECTOMY  1978   BREAST EXCISIONAL BIOPSY Right    BREAST LUMPECTOMY WITH RADIOACTIVE SEED LOCALIZATION Right 07/10/2016   Procedure: RIGHT BREAST LUMPECTOMY WITH RADIOACTIVE SEED LOCALIZATION;  Surgeon: Lillette Reid III, MD;  Location: [facility];  Service: General;  Laterality: Right;   GASTRIC ROUX-EN-Y  03/09/2012   Procedure: LAPAROSCOPIC ROUX-EN-Y GASTRIC;  Surgeon: Fran Imus, MD,FACS;  Location: [facility];  Service: General;  Laterality: N/A;   low back  2010   x3   ORIF ELBOW FRACTURE Right 09/20/2021   Procedure: OPEN  REDUCTION INTERNAL FIXATION (ORIF) RIGHT OLECRANON FRACTURE;  Surgeon: Bettyjane Brunet, MD;  Location: [facility];  Service: Orthopedics;  Laterality: Right;   right ureter surgery     in college   UPPER GI ENDOSCOPY  03/09/2012   Procedure: UPPER GI ENDOSCOPY;  Surgeon: Fran Imus, MD,FACS;  Location: [facility];  Service: General;  Laterality: N/A;     Current Outpatient Medications  Medication Sig Dispense Refill   Ascorbic Acid (VITAMIN C PO) Take 500 mg by mouth 2 (two) times daily.     ASPIRIN 81 PO Take by mouth daily.     atorvastatin (LIPITOR) 10 MG tablet Take 10 mg by mouth daily.     budesonide (ENTOCORT EC) 3 MG 24 hr capsule Take 9 mg by mouth daily.     buPROPion  (WELLBUTRIN  XL) 150 MG 24 hr tablet Take 150 mg by mouth daily.     busPIRone (BUSPAR) 7.5 MG tablet Take 7.5 mg by mouth 3 (three) times daily as needed (anxiety).     calcium-vitamin D  (OSCAL WITH D) 500-200 MG-UNIT tablet Take 1 tablet by mouth daily.     diclofenac (VOLTAREN) 75 MG EC tablet Take 75 mg by mouth as directed.     ferrous sulfate 325 (65 FE) MG tablet Take 325  mg by mouth in the morning and at bedtime.     FLUoxetine  (PROZAC ) 40 MG capsule Take 20 mg by mouth daily.      hydrochlorothiazide (MICROZIDE) 12.5 MG capsule Take 12.5 mg by mouth daily.     losartan  (COZAAR ) 50 MG tablet Take 50 mg by mouth daily.     mupirocin ointment (BACTROBAN) 2 % Apply 1 Application topically 2 (two) times daily.     traZODone  (DESYREL ) 50 MG tablet Take 1 tablet (50 mg total) by mouth at bedtime. 90 tablet 1   zolpidem  (AMBIEN ) 10 MG tablet Take 10 mg by mouth at bedtime as needed.     No current facility-administered medications for this visit.    Allergies:   Codeine    Social History:  The patient  reports that she has never smoked. She has never used smokeless tobacco. She reports current alcohol use of about 1.0 - 2.0 standard drink of alcohol per week. She reports that she does not use  drugs.   Family History:  The patient's family history includes Breast cancer in her paternal aunt; Cancer in her maternal grandmother and mother; Cancer (age of onset: 38) in her paternal aunt and paternal aunt; Congestive Heart Failure in her paternal grandmother; HIV in her brother; Heart attack in her maternal grandfather; Heart disease in her father; Lung cancer in her mother; Melanoma (age of onset: 65) in her son; Stroke in her paternal grandfather.    ROS:  Please see the history of present illness.   Otherwise, review of systems are positive for none.   All other systems are reviewed and negative.    PHYSICAL EXAM: VS:  BP 132/76 (BP Location: Right Arm, Cuff Size: Normal)   Pulse 86   Ht 5\' 1"  (1.549 m)   Wt 123 lb (55.8 kg)   SpO2 96%   BMI 23.24 kg/m  , BMI Body mass index is 23.24 kg/m. GEN: Well nourished, well developed, in no acute distress  HEENT: normal  Neck: no JVD, carotid bruits, or masses Cardiac: RRR; no murmurs, rubs, or gallops,no edema  Respiratory:  clear to auscultation bilaterally, normal work of breathing GI: soft, nontender, nondistended, + BS MS: no deformity or atrophy  Skin: warm and dry, no rash Neuro:  Strength and sensation are intact Psych: euthymic mood, full affect       Recent Labs: 02/05/2023: ALT 23; BUN 13; Creatinine, Ser 0.70; Hemoglobin 13.2; Platelets 375; Potassium 3.4; Sodium 138    Lipid Panel    Component Value Date/Time   CHOL 242 (H) 07/29/2012 1121   TRIG 87 07/29/2012 1121   HDL 54 07/29/2012 1121   CHOLHDL 4.5 07/29/2012 1121   VLDL 17 07/29/2012 1121   LDLCALC 171 (H) 07/29/2012 1121    Labs dated 10/15/18: cholesterol 216, triglycerides 62, HDL 125, LDL 79. CMET normal.  Dated 08/28/20: cholesterol 187, triglycerides 59, HDL 96, LDL 80. CMET and CBC normal. Dated 12/30/21: cholesterol 213, triglycerides 61, HDL 124, LDL 78. A1c 5.4%. CBC, CMET, TSH normal  Wt Readings from Last 3 Encounters:  09/14/23 123 lb  (55.8 kg)  04/11/22 147 lb (66.7 kg)  02/03/22 168 lb (76.2 kg)    EKG Interpretation Date/Time:  Monday Sep 14 2023 16:48:28 EDT Ventricular Rate:  81 PR Interval:  142 QRS Duration:  74 QT Interval:  412 QTC Calculation: 478 R Axis:   27  Text Interpretation: Normal sinus rhythm Normal ECG When compared with ECG of 09-Sep-2021 16:32, No  significant change was found Confirmed by [place], Balraj Brayfield [phone]) on 09/14/2023 5:00:14 PM    Other studies Reviewed: Additional studies/ records that were reviewed today include: none    ASSESSMENT AND PLAN:  1.  HTN. Well controlled on combination of losartan  and HCT.   2. Hypercholesterolemia. LDL 83 on lipitor.  3. Morbid obesity. Excellent response to gastric bypass. Weight is good.    Current medicines are reviewed at length with the patient today.  The patient does not have concerns regarding medicines.  The following changes have been made:  See above  Labs/ tests ordered today include: none  Orders Placed This Encounter  Procedures   EKG 12-Lead      Disposition:   FU with me in 1 year  Signed, Tammy Ericsson [place], MD  09/14/2023 5:09 PM    [facility] [address] [place], [place], [address] Phone [phone], Fax [phone]

## 2023-09-14 ENCOUNTER — Ambulatory Visit: Payer: Medicare PPO | Attending: Cardiology | Admitting: Cardiology

## 2023-09-14 ENCOUNTER — Encounter: Payer: Self-pay | Admitting: Cardiology

## 2023-09-14 ENCOUNTER — Ambulatory Visit: Payer: Medicare PPO | Admitting: Adult Health

## 2023-09-14 VITALS — BP 132/76 | HR 86 | Ht 61.0 in | Wt 123.0 lb

## 2023-09-14 DIAGNOSIS — I10 Essential (primary) hypertension: Secondary | ICD-10-CM | POA: Diagnosis not present

## 2023-09-14 NOTE — Patient Instructions (Signed)
 Medication Instructions:  Continue same medications *If you need a refill on your cardiac medications before your next appointment, please call your pharmacy*  Lab Work: None ordered  Testing/Procedures: None ordered  Follow-Up: At [facility], you and your health needs are our priority.  As part of our continuing mission to provide you with exceptional heart care, our providers are all part of one team.  This team includes your primary Cardiologist (physician) and Advanced Practice Providers or APPs (Physician Assistants and Nurse Practitioners) who all work together to provide you with the care you need, when you need it.  Your next appointment:  1 year   Call in Feb to schedule May appointment     Provider:  Dr.Jordan   We recommend signing up for the patient portal called "MyChart".  Sign up information is provided on this After Visit Summary.  MyChart is used to connect with patients for Virtual Visits (Telemedicine).  Patients are able to view lab/test results, encounter notes, upcoming appointments, etc.  Non-urgent messages can be sent to your provider as well.   To learn more about what you can do with MyChart, go to [email or web address]

## 2024-06-07 ENCOUNTER — Telehealth: Payer: Self-pay | Admitting: Neurology

## 2024-06-07 NOTE — Telephone Encounter (Signed)
 [facility] Teddi Mirza, Aashka)  Patient has had a lot of memory changes and requesting to be sooner than June. Memory is declining Contact my [phone] to discuss.

## 2024-06-07 NOTE — Telephone Encounter (Signed)
 Pt called stating someone called  her to schedule appt

## 2024-06-08 NOTE — Telephone Encounter (Signed)
 Please schedule sooner with MD. Manuelita to use 1130's for this.  Her pcp wanting sooner appt.

## 2024-06-08 NOTE — Telephone Encounter (Signed)
 Phone rep called pt to schedule an earlier appointment with MD, left message asking pt to call office.

## 2024-06-08 NOTE — Telephone Encounter (Signed)
 I called pt and made her earlier appt 06-16-2024 at 1130 arrive 1100 for check in.  She verbalized understanding.

## 2024-06-08 NOTE — Telephone Encounter (Addendum)
 [name] PA called back.  She relayed that she wanted pt seen for worsening memory.  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 To see [facility] neurosurgery, but is stable per [name]/PA. Lives alone. Trying to get her in sooner.

## 2024-06-08 NOTE — Telephone Encounter (Signed)
 I called [name] PA.  Relayed that Dr. Chalice saw her for memory.  2 NP appts cancelled.  We are happy to get her back in with NP for worsening memory. Call back if needed.

## 2024-06-09 NOTE — Telephone Encounter (Signed)
 I relayed to pcp office per pcp request, appt date and time.

## 2024-06-10 NOTE — Telephone Encounter (Signed)
 Lauraine Mirza NP called to request to speak to nurse . Lauraine would really like  to speak to MD  to let MD know what's going on . Lauraine stated  it will help if MD giver her a call back before Pt next appt    Cell number is [phone]

## 2024-06-16 ENCOUNTER — Ambulatory Visit: Admitting: Neurology

## 2024-06-16 ENCOUNTER — Encounter: Payer: Self-pay | Admitting: Neurology

## 2024-06-16 ENCOUNTER — Telehealth: Payer: Self-pay | Admitting: Neurology

## 2024-06-16 VITALS — BP 147/81 | HR 82 | Ht 61.0 in | Wt 133.0 lb

## 2024-06-16 DIAGNOSIS — F0781 Postconcussional syndrome: Secondary | ICD-10-CM

## 2024-06-16 DIAGNOSIS — Z9884 Bariatric surgery status: Secondary | ICD-10-CM

## 2024-06-16 DIAGNOSIS — S069X1A Unspecified intracranial injury with loss of consciousness of 30 minutes or less, initial encounter: Secondary | ICD-10-CM

## 2024-06-16 DIAGNOSIS — Z9181 History of falling: Secondary | ICD-10-CM

## 2024-06-16 DIAGNOSIS — S065X1A Traumatic subdural hemorrhage with loss of consciousness of 30 minutes or less, initial encounter: Secondary | ICD-10-CM

## 2024-06-16 DIAGNOSIS — R7689 Other specified abnormal immunological findings in serum: Secondary | ICD-10-CM

## 2024-06-16 NOTE — Telephone Encounter (Signed)
 Referral for neuropsychology sent through [facility] to [facility]. Phone: [phone], Fax: [phone]

## 2024-06-16 NOTE — Progress Notes (Signed)
 "       Provider:  Dedra Gores, MD  Primary Care Physician:  Jacques Camie Pepper, PA-C [address] [place] [address]     Referring Provider: Jacques Camie Pepper, Pa-c [address] [place],  [place] [address]          Chief Complaint according to patient   Patient presents with:     New Patient (Initial Visit)       reports there has been a slight decline in short tern memory since last visit 01/2023. She also mentions she had a fall and had a concussion in Dec. She also lost her husband last summer.             HISTORY OF PRESENT ILLNESS:  [name]  S [name] is a 74 y.o. 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  Chief concern ( according to patient)  :  she is widowed since last year- [place]  [place] and she fell onto the floor, this was during the night and she wanted to void. H She has been unclear about what caused the fall ,  and  helicopter evacuation for subdural hemorrhage on [facility]. She was at the home alone , was helped by a neighbor.   The local hospital then transferred her to [facility], and she was dx with SDH,  neurosurgery followed 06-06-2024, no drain needed.   There have been 3 falls in 4 years. This time, she had a dinner guest and she took alcohol  and not much later her sleeping pill, Ambien .  Her PCP has now changed her to Seroquel.  She reports she has no longer alcohol in the home and stopped drinking.    Her cognition may have changed since the brain injury , she still has tinnitus on the right ear,  her black eye ( right ) has resolved and she is a bit lightheaded. The patient has adequate hearing and vision ability.  No loss of  smell. Initially she slept more after the fall, which was only 6 weeks  ago, but this has decreased. Ambulation : lightheadedness.       Espyn  S Andujo reports  no difficulties with  memory, orientation,  but some times with word-finding. The following examples were provided:  I am driving all over town, and have not gotten lost, I stick to the right lane and use a navigation system.    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 Abuse  substance abuse, no mental illness. Anxiety.  ( Reviewed evidence  or documentation of :  2 documented times of Trauma such as TBI/ whiplash,  skin cancer, Vitamin deficiency,  Anemia, has been at risk of malabsorption  due to gastric bypass ,  GERD,  Current medications as listed did not contain anticholinergic substances, narcotics, benzodiazepines.    Tula  S Cena is reporting independence in the following activities of daily living: activities of daily living   The patient drives.  He/She goes shopping or orders items for daily needs, has access to fresh food in the home, and eats regular meals.  Meals are prepared by the patient or in the household. The patient eats out. The patient eats little  processed food : fast food or canned food, frozen meals.   Communication: able to use a land line telephone, a mobile phone, or a computer.She makes appointments by phone or online and keeps appointments.  Reports independence in bill paying, banking, and files paperwork such as for insurance or taxes.    Family history : Impaired cognitive function or dementia were affecting the following biological  family members:   none.    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   Nicotine use: one .   ETOH use yes, regular - now cut down. ,  Substance use otherwise: Ambien  for years.  Caffeine intake in form of Coffee( /) Soda( 3 a week ) Tea ( /) ,no  energy drinks.  Physical activity: yes .   Hobbies and Social activities:    The referring physician  has kindly provided the following clinical history information and evolution of symptoms , imaging  and test results:   MMSE:      No data to display            MOCA;     06/16/2024   11:36 AM 02/05/2023    2:23 PM 06/23/2022    2:13 PM  Montreal Cognitive Assessment   Visuospatial/ Executive (0/5) 5 5 5   Naming (0/3) 3 3 3   Attention: Read list of digits (0/2) 2 2 2   Attention: Read list of letters (0/1) 1 0 1  Attention: Serial 7 subtraction starting at 100 (0/3) 2 3 3   Language: Repeat phrase (0/2) 1 2 2   Language : Fluency (0/1) 1 1 1   Abstraction (0/2) 2 2 2   Delayed Recall (0/5) 4 1 3   Orientation (0/6) 6 6 6   Total 27 25 28      CT head: see reports.   MRI brain: Ataxia related , obtained 2024, lacunar strokes.        Review of Systems: Out of a complete 14 system review, the patient complains of only the following symptoms, and all other reviewed systems are negative.:  See above    Social History   Socioeconomic History   Marital status: Married    Spouse name: Not on file   Number of children: 2   Years of education: Not on file   Highest education level: Not on file  Occupational History   Occupation: retired runner, broadcasting/film/video  Tobacco Use   Smoking status: Never   Smokeless tobacco: Never  Vaping Use   Vaping status: Never Used  Substance and Sexual Activity   Alcohol use: Yes    Alcohol/week: 1.0 - 2.0 standard drink of alcohol    Types: 1 - 2 Glasses of wine per week    Comment: 1-2 glass winn daily    Drug use: No   Sexual activity: Not on file  Other Topics Concern   Not on file  Social History Narrative   Not on file   Social Drivers of Health   Tobacco Use:  Low Risk (06/16/2024)   Patient History    Smoking Tobacco Use: Never    Smokeless Tobacco Use: Never    Passive Exposure: Not on file  Recent Concern: Tobacco Use - Medium Risk (06/06/2024)   Received from [facility]  Patient History    Smoking Tobacco Use: Former    Smokeless Tobacco Use: Never    Passive Exposure: Past  Physicist, Medical Strain: Low Risk (05/17/2024)   Received from [facility]   Overall Financial Resource Strain (CARDIA)    How hard is it for you to pay for the very basics like food, housing, medical care, and heating?: Not hard at all  Food Insecurity: No Food Insecurity (05/17/2024)   Received from [facility]   Epic    Within the past 12 months, you worried that your food would run out before you got the money to buy more.: Never true    Within the past 12 months, the food you bought just didn't last and you didn't have money to get more.: Never true  Transportation Needs: No Transportation Needs (05/17/2024)   Received from [facility]    In the past 12 months, has lack of transportation kept you from medical appointments or from getting medications?: No    In the past 12 months, has lack of transportation kept you from meetings, work, or from getting things needed for daily living?: No  Physical Activity: Sufficiently Active (11/18/2023)   Received from [facility]   Exercise Vital Sign    On average, how many days per week do you engage in moderate to strenuous exercise (like a brisk walk)?: 5 days    On average, how many minutes do you engage in exercise at this level?: 40 min  Stress: Stress Concern Present (11/18/2023)   Received from [facility] - Occupational Stress Questionnaire    Feeling of Stress : To some extent  Social Connections: Socially Integrated (11/18/2023)   Received from [facility]   Social Network    How would you rate your social network (family, work, friends)?: Good  participation with social networks  Depression (PHQ2-9): Not on file  Alcohol Screen: Not on file  Housing: Low Risk (05/17/2024)   Received from [facility]    In the last 12 months, was there a time when you were not able to pay the mortgage or rent on time?: No    In the past 12 months, how many times have you moved where you were living?: 0    At any time in the past 12 months, were you homeless or living in a shelter (including now)?: No  Utilities: Not At Risk (05/17/2024)   Received from [facility]    In the past 12 months has the electric, gas, oil, or water company threatened to shut off services in your home?: No  Health Literacy: Not on file    Family History  Problem Relation Age of Onset   Lung cancer Mother    Cancer Mother        lung   Heart disease Father    HIV Brother    Cancer Paternal Aunt 13       pancreatic cancer    Breast cancer Paternal Aunt    Cancer Maternal Grandmother        liver cancer    Cancer Paternal Aunt 41       breast cancer   Heart attack Maternal Grandfather    Congestive Heart Failure Paternal Grandmother    Stroke Paternal Grandfather    Melanoma Son 37    Past Medical History:  Diagnosis Date   Arthritis    feet,knees,hands  Cancer (HCC)    melanoma   Chronic anxiety    Family history of breast cancer    Family history of melanoma    Family history of pancreatic cancer    Hyperlipidemia    Hypertension    Obesity    Personal history of malignant melanoma     Past Surgical History:  Procedure Laterality Date   ABDOMINAL HYSTERECTOMY     APPENDECTOMY  1978   BREAST EXCISIONAL BIOPSY Right    BREAST LUMPECTOMY WITH RADIOACTIVE SEED LOCALIZATION Right 07/10/2016   Procedure: RIGHT BREAST LUMPECTOMY WITH RADIOACTIVE SEED LOCALIZATION;  Surgeon: Deward Null III, MD;  Location: [facility];  Service: General;  Laterality: Right;   GASTRIC ROUX-EN-Y  03/09/2012   Procedure: LAPAROSCOPIC ROUX-EN-Y  GASTRIC;  Surgeon: Camellia CHRISTELLA Blush, MD,FACS;  Location: [facility];  Service: General;  Laterality: N/A;   low back  2010   x3   ORIF ELBOW FRACTURE Right 09/20/2021   Procedure: OPEN REDUCTION INTERNAL FIXATION (ORIF) RIGHT OLECRANON FRACTURE;  Surgeon: Doll Skates, MD;  Location: [facility];  Service: Orthopedics;  Laterality: Right;   right ureter surgery     in college   UPPER GI ENDOSCOPY  03/09/2012   Procedure: UPPER GI ENDOSCOPY;  Surgeon: Camellia CHRISTELLA Blush, MD,FACS;  Location: [facility];  Service: General;  Laterality: N/A;     Medications Ordered Prior to Encounter[1]  Allergies[2]   DIAGNOSTIC DATA (LABS, IMAGING, TESTING) - I reviewed patient records, labs, notes, testing and imaging myself where available.  Lab Results  Component Value Date   WBC 8.4 02/05/2023   HGB 13.2 02/05/2023   HCT 40.3 02/05/2023   MCV 97 02/05/2023   PLT 375 02/05/2023      Component Value Date/Time   NA 138 02/05/2023 1542   K 3.4 (L) 02/05/2023 1542   CL 100 02/05/2023 1542   CO2 22 02/05/2023 1542   GLUCOSE 88 02/05/2023 1542   GLUCOSE 96 09/09/2021 1643   BUN 13 02/05/2023 1542   CREATININE 0.70 02/05/2023 1542   CREATININE 0.88 07/29/2012 1121   CALCIUM 9.6 02/05/2023 1542   PROT 6.4 02/05/2023 1542   ALBUMIN 4.4 02/05/2023 1542   AST 24 02/05/2023 1542   ALT 23 02/05/2023 1542   ALKPHOS 66 02/05/2023 1542   BILITOT 0.2 02/05/2023 1542   GFRNONAA >60 09/09/2021 1643   GFRAA >60 07/07/2016 1217   Lab Results  Component Value Date   CHOL 242 (H) 07/29/2012   HDL 54 07/29/2012   LDLCALC 171 (H) 07/29/2012   TRIG 87 07/29/2012   CHOLHDL 4.5 07/29/2012   Lab Results  Component Value Date   HGBA1C 4.9 02/05/2023   Lab Results  Component Value Date   VITAMINB12 337 06/23/2022   Lab Results  Component Value Date   TSH 2.297 01/21/2012    PHYSICAL EXAM:  Tabular:  No data found.   Body mass index is 25.13 kg/m.   Wt Readings from Last 3 Encounters:   06/16/24 133 lb (60.3 kg)  09/14/23 123 lb (55.8 kg)  04/11/22 147 lb (66.7 kg)     Ht Readings from Last 3 Encounters:  06/16/24 5' 1 (1.549 m)  09/14/23 5' 1 (1.549 m)  02/05/23 5' 1 (1.549 m)      Cardiovascular:  Regular rate and cardiac rhythm by pulse,  without distended neck veins. Respiratory: no tachypnoea or wheezing. .  Skin:  Without evidence of ankle edema, rash, bruising  The patient's posture  was  erect.   NEUROLOGIC EXAM: The patient was awake and alert, oriented to place and time.   Attention span & concentration ability appeared normal.  Speech was fluent, without dysarthria, dysphonia or aphasia, and of normal volume.     Cranial nerves:  There was   loss of smell or taste reported  Pupils are round, equal in size and briskly reactive to light.  Funduscopic exam was normal.  Extraocular movements in vertical and horizontal planes were intact and without nystagmus. (No Diplopia reported). Visual fields by finger perimetry are intact. Hearing was intact to soft voice.    Facial sensation intact ( fine touch).  Facial motor strength: Symmetric movement and tongue and uvula move midline.  Neck ROM: rotation, tilt and flexion /extension were observed,  shoulder shrug was symmetrical. (!)   Motor exam:  Symmetric bulk, strength and ROM.   Muscle tone was  without cog- wheeling, and there was symmetric grip strength.   Sensory:  Fine touch and vibration were tested by tuning fork and intact.  Proprioception tested in the upper extremities was normal.   Coordination: The patient reported no problems with button closure and no changes to penmanship.   The Finger-to-nose maneuver was intact without evidence of  action and resting tremor, right over left  dysmetria .   Gait and station: Patient could rise unassisted from a seated position, without  bracing, and walked without assistive device.  Stance was of increased width.  ROMBERG NEGATIVE   The patient  turned with  4 steps. Toe and heel walk were deferred. No limp was noted.  Arm swing was preserved. The patient's gait posture was erect.   Deep tendon reflexes: Upper extremities did show symmetric DTRs. Lower extremity DTRs were symmetric and brisk/        Dear Jacques, Camie Pepper, Pa-c [address] [place],  [place] [address],    Thank you for entrusting me with your patient's care!.    Mrs. Johndrow , a 74 y.o. female presented here on 06-16-2024 for an Evaluation of SDH sequale  upon your request .   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 This TBI/  SDH caused  symptoms typical of a concussion a with tinnitus, lightheadedness and some delayed word-finding in conversation. Her son had been concerned about her repeating questions at home more often, which aI could not see here. It is possible that the worst of CNS related symptoms has resolved.    ADL 13/ 13=  all independent.   2)MOCA was 27/ 30 points !  Romberg negative , gait stability was good, no nystagmus.    I agree with the Seroquel being a better sleep aid at low doses.  25 mg at night , if needed increase to 50 mg.  I reviewed previously obtained MRI and ATN and GNA panel labs.   I plan to follow up through our NP or personally within 12 month per patient's preference. .    The patient's condition requires frequent monitoring and adjustments in the treatment plan, reflecting the ongoing complexity of care.  This provider is for the named interval time the continuing focal point for associated medical /neurological needs services for this condition.   A total time of  55  minutes consistent of a part of face to face encounter , exam and interview,  and additional preparation time for chart review was spent.  Additionally, the following were reviewed: Past medical records, past medical and surgical history, family and social background, as well as  relevant laboratory results, imaging findings, and medical notes, where applicable.  At today's visit, we discussed treatment options, associated risk and benefits, and engage in counseling as needed: Including, but not limited to driving safety, home safety, the benefit of routines and activities.   Memory strategies were provided in the patient education attachment.     Recommendations:  1. Follow up with the referring provider as planned.  2. Based on the patient's cognitive test profile, patient will require yearly retesting to  catch if small functional changes are occurring that may not be evident in daily life , and having adequate observation/checks is recommended.  As difficulties increase they can be detected and adjusted for as needed.  3. Planning for future needs of the patient is strongly recommended. As discussed during feedback and noted above, planning for future declines is best done earlier rather than later.  This allows for the patient's participation (as much as is possible) in such decisions while able, and avoids challenging situations involving needing to problem-solve and identify resources in response to an acute event. Currently , there is not  need for external supports (i.e., family, home-health aid) may develop and increase over time in order to ensure highest possible quality of life.  4. If the patient shows memory difficulties, she may  benefit from reminders and things which jog  memory (cues).       This note was generated in part by using dictation software, and as a result, it may contain unintentional typos and errors.  Nevertheless, effort was made to accurately convey the pertinent aspects of the patient's visit.    Electronically signed by: Dedra Gores, MD 06/16/2024 12:00 PM  [facility] and [facility] Sleep Board certified by The [employer] of Sleep Medicine and Diplomate of the [employer] of Sleep Medicine. Board  certified In Neurology through the ABPN, Fellow of the [employer] of Neurology. [email or web address].     PS : Tips for Healthy Aging:   Read the MIND DIET book for nutritional information.  Regular physical activity and daylight exposure. This lifts the mood and entrains a circadian rhythm, leading to better sleep and frees up the mind.   Walking a minimum of  20 minutes a day , if possible outdoors, preferable in a park or nature area.  Indoor exercised such as stretching , yoga , stationary bike or stair master ( at the gym). Read !  Keep up with daily events , news.  Maintain or establish new Hobbies , consider Volunteering, and consider becoming a member of a club, church or other community memberships and engagements. Social interactions give purpose, joy and are interactive - Interaction is brain jogging!   Reconsider your driving ability-   Reconsider your home : The home is a single storey ( ranch/ apartment) ?  If you live in a multistory residence, are all stairs equipped with solid handrails on both sides ?  Can any existing staircase be retrofitted with a stair lift .  Do you need an entry ramp ?  The home that is suited to aging in space has low or no barriers to enter , to reach the lavatory  ( wide doors , high toilet seat, handrails), has a no barrier shower ( no tub ) with a scold guard water temperature setting, handheld shower head , and a freestanding shower  seat ( consider a plastic garden armchair !) .   And are the doors wide enough to pass with walker or wheelchair?  Is there enough space in the bedroom to reach the bed from two or better three sides.  De- clutter and arrange your home for safety: place objects at eye height , not above- not below hip height-  you should not need to use a stepstool or ladder.   No overlapping area rugs, sufficient light, and passage space free of furniture , remove breakable items from side tables, night stands.  A Kitchen stovetop  powered by induction prevents injuries and fires- replace gas tops and toaster ovens.       [1]  Current Outpatient Medications on File Prior to Visit  Medication Sig Dispense Refill   Ascorbic Acid (VITAMIN C PO) Take 500 mg by mouth 2 (two) times daily.     atorvastatin (LIPITOR) 10 MG tablet Take 10 mg by mouth daily.     budesonide (ENTOCORT EC) 3 MG 24 hr capsule Take 9 mg by mouth daily.     buPROPion  (WELLBUTRIN  XL) 150 MG 24 hr tablet Take 150 mg by mouth daily.     busPIRone (BUSPAR) 7.5 MG tablet Take 7.5 mg by mouth 3 (three) times daily as needed (anxiety).     calcium-vitamin D  (OSCAL WITH D) 500-200 MG-UNIT tablet Take 1 tablet by mouth daily.     ferrous sulfate 325 (65 FE) MG tablet Take 325 mg by mouth in the morning and at bedtime.     FLUoxetine  (PROZAC ) 40 MG capsule Take 20 mg by mouth daily.      hydrochlorothiazide (MICROZIDE) 12.5 MG capsule Take 12.5 mg by mouth daily.     losartan  (COZAAR ) 50 MG tablet Take 50 mg by mouth daily.     QUEtiapine (SEROQUEL) 25 MG tablet Take 25 mg by mouth at bedtime.     No current facility-administered medications on file prior to visit.  [2]  Allergies Allergen Reactions   Codeine Nausea Only   "

## 2024-06-16 NOTE — Patient Instructions (Signed)
 [name] [name] , a 74 y.o. female presented here on 06-16-2024 for an Evaluation of SDH sequale  upon your request .     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 This TBI/  SDH caused  symptoms typical of a concussion a with tinnitus, lightheadedness and some delayed word-finding in conversation. Her son had been concerned about her repeating questions at home more often, which aI could not see here. It is possible that the worst of CNS related symptoms has resolved.      ADL 13/ 13=  all independent.    2)MOCA was 27/ 30 points !  Romberg negative , gait stability was good, no nystagmus.      I agree with the Seroquel being a better sleep aid at low doses.  25 mg at night , if needed increase to 50 mg.   I reviewed previously obtained MRI and ATN and [facility]NA panel labs.    I plan to follow up through our NP or personally within 12 month per patient's preference.  I have referred to neuropsychology for a baseline evaluation.    [name] Gores, MD

## 2024-11-03 ENCOUNTER — Ambulatory Visit: Admitting: Neurology

## 2025-06-19 ENCOUNTER — Ambulatory Visit: Admitting: Neurology
